# Patient Record
Sex: Male | Born: 1935 | Race: White | Hispanic: No | Marital: Married | Smoking: Never smoker
Health system: Southern US, Community
[De-identification: ages and names within clinical notes are randomized; demographics above are authoritative.]

## PROBLEM LIST (undated history)

## (undated) DIAGNOSIS — E785 Hyperlipidemia, unspecified: Secondary | ICD-10-CM

## (undated) DIAGNOSIS — I10 Essential (primary) hypertension: Secondary | ICD-10-CM

## (undated) DIAGNOSIS — F0280 Dementia in other diseases classified elsewhere without behavioral disturbance: Secondary | ICD-10-CM

## (undated) HISTORY — DX: Dementia in other diseases classified elsewhere, unspecified severity, without behavioral disturbance, psychotic disturbance, mood disturbance, and anxiety: F02.80

## (undated) HISTORY — PX: OTHER SURGICAL HISTORY: SHX169

## (undated) HISTORY — DX: Hyperlipidemia, unspecified: E78.5

## (undated) HISTORY — DX: Essential (primary) hypertension: I10

---

## 2003-07-19 ENCOUNTER — Other Ambulatory Visit: Payer: Self-pay

## 2004-11-06 ENCOUNTER — Ambulatory Visit: Payer: Self-pay | Admitting: Unknown Physician Specialty

## 2006-07-07 ENCOUNTER — Emergency Department: Payer: Self-pay | Admitting: Emergency Medicine

## 2006-07-07 ENCOUNTER — Other Ambulatory Visit: Payer: Self-pay

## 2010-03-18 ENCOUNTER — Ambulatory Visit: Payer: Self-pay | Admitting: Unknown Physician Specialty

## 2010-03-20 LAB — PATHOLOGY REPORT

## 2013-07-03 ENCOUNTER — Ambulatory Visit: Payer: Self-pay | Admitting: Neurology

## 2016-02-06 ENCOUNTER — Emergency Department: Payer: Medicare Other

## 2016-02-06 ENCOUNTER — Emergency Department
Admission: EM | Admit: 2016-02-06 | Discharge: 2016-02-06 | Disposition: A | Discharge status: Home / Self Care | Payer: Medicare Other | Attending: Emergency Medicine | Admitting: Emergency Medicine

## 2016-02-06 ENCOUNTER — Encounter: Payer: Self-pay | Admitting: *Deleted

## 2016-02-06 DIAGNOSIS — R4182 Altered mental status, unspecified: Secondary | ICD-10-CM | POA: Diagnosis not present

## 2016-02-06 DIAGNOSIS — Z7982 Long term (current) use of aspirin: Secondary | ICD-10-CM | POA: Diagnosis not present

## 2016-02-06 DIAGNOSIS — J111 Influenza due to unidentified influenza virus with other respiratory manifestations: Secondary | ICD-10-CM | POA: Diagnosis not present

## 2016-02-06 DIAGNOSIS — Z7984 Long term (current) use of oral hypoglycemic drugs: Secondary | ICD-10-CM | POA: Diagnosis not present

## 2016-02-06 DIAGNOSIS — R05 Cough: Secondary | ICD-10-CM | POA: Diagnosis present

## 2016-02-06 LAB — COMPREHENSIVE METABOLIC PANEL
ALK PHOS: 42 U/L (ref 38–126)
ALT: 24 U/L (ref 17–63)
ANION GAP: 9 (ref 5–15)
AST: 36 U/L (ref 15–41)
Albumin: 4.3 g/dL (ref 3.5–5.0)
BUN: 24 mg/dL — ABNORMAL HIGH (ref 6–20)
CO2: 25 mmol/L (ref 22–32)
CREATININE: 1.46 mg/dL — AB (ref 0.61–1.24)
Calcium: 8.9 mg/dL (ref 8.9–10.3)
Chloride: 99 mmol/L — ABNORMAL LOW (ref 101–111)
GFR calc Af Amer: 50 mL/min — ABNORMAL LOW (ref >60)
GFR calc non Af Amer: 43 mL/min — ABNORMAL LOW (ref >60)
Glucose, Bld: 95 mg/dL (ref 65–99)
Potassium: 4.4 mmol/L (ref 3.5–5.1)
Sodium: 133 mmol/L — ABNORMAL LOW (ref 135–145)
Total Bilirubin: 0.7 mg/dL (ref 0.3–1.2)
Total Protein: 7 g/dL (ref 6.5–8.1)

## 2016-02-06 LAB — CBC
HCT: 39 % — ABNORMAL LOW (ref 40.0–52.0)
HEMOGLOBIN: 13.4 g/dL (ref 13.0–18.0)
MCH: 31.4 pg (ref 26.0–34.0)
MCHC: 34.4 g/dL (ref 32.0–36.0)
MCV: 91.3 fL (ref 80.0–100.0)
PLATELETS: 100 10*3/uL — AB (ref 150–440)
RBC: 4.27 MIL/uL — ABNORMAL LOW (ref 4.40–5.90)
RDW: 12.7 % (ref 11.5–14.5)
WBC: 5.2 10*3/uL (ref 3.8–10.6)

## 2016-02-06 LAB — INFLUENZA PANEL BY PCR (TYPE A & B)
INFLBPCR: NEGATIVE
Influenza A By PCR: POSITIVE — AB

## 2016-02-06 LAB — LACTIC ACID, PLASMA: LACTIC ACID, VENOUS: 1 mmol/L (ref 0.5–1.9)

## 2016-02-06 LAB — URINALYSIS, COMPLETE (UACMP) WITH MICROSCOPIC
BACTERIA UA: NONE SEEN
Bilirubin Urine: NEGATIVE
Glucose, UA: NEGATIVE mg/dL
KETONES UR: NEGATIVE mg/dL
Leukocytes, UA: NEGATIVE
NITRITE: NEGATIVE
PROTEIN: 30 mg/dL — AB
Specific Gravity, Urine: 1.018 (ref 1.005–1.030)
pH: 6 (ref 5.0–8.0)

## 2016-02-06 LAB — TROPONIN I
Troponin I: 0.04 ng/mL (ref <0.03)
Troponin I: 0.04 ng/mL (ref <0.03)

## 2016-02-06 MED ORDER — ACETAMINOPHEN 500 MG PO TABS
ORAL_TABLET | ORAL | Status: AC
  Filled 2016-02-06: qty 2

## 2016-02-06 MED ORDER — ACETAMINOPHEN 500 MG PO TABS
1000.0000 mg | ORAL_TABLET | Freq: Once | ORAL | Status: AC
  Administered 2016-02-06: 1000 mg via ORAL

## 2016-02-06 MED ORDER — SODIUM CHLORIDE 0.9 % IV BOLUS (SEPSIS)
1000.0000 mL | Freq: Once | INTRAVENOUS | Status: AC
  Administered 2016-02-06: 1000 mL via INTRAVENOUS

## 2016-02-06 NOTE — ED Provider Notes (Signed)
[facility] Emergency Department Provider Note  ____________________________________________   I have reviewed the triage vital signs and the nursing notes.   HISTORY  Chief Complaint Altered Mental Status; Fall; and Code Sepsis   History limited by: dementia, history obtained from wife   HPI Bruce [name] is a 83 y.o. male who presents to the emergency department today because of wife's concern for increased confusion and abnormal behavior. The wife states that she really noticed him acting strangely last night. He also was incontinent of urine and missed the toilet. She states this is very unusual for the patient. She states for the past 3 days however the patient has had cold-like symptoms. He has had a cough. He also is complaining of some headache. The patient has had no known sick contacts.   No past medical history on file.  There are no active problems to display for this patient.   No past surgical history on file.  Prior to Admission medications   Not on File    Allergies Patient has no known allergies.  No family history on file.  Social History Social History  Substance Use Topics  . Smoking status: Never Smoker  . Smokeless tobacco: Never Used  . Alcohol use Yes    Review of Systems  Constitutional: Negative for fever. Cardiovascular: Negative for chest pain. Respiratory: Positive for cough. Gastrointestinal: Negative for abdominal pain, vomiting and diarrhea. Genitourinary: Negative for dysuria. Musculoskeletal: Negative for back pain. Skin: Negative for rash. Neurological: Positive for headache.  10-point ROS otherwise negative.  ____________________________________________   PHYSICAL EXAM:  VITAL SIGNS: ED Triage Vitals  Enc Vitals Group     BP 02/06/16 1558 (!) 127/49     Pulse Rate 02/06/16 1558 76     Resp 02/06/16 1558 20     Temp 02/06/16 1558 (!) 100.5 F (38.1 C)     Temp Source 02/06/16 1558 Oral     SpO2 02/06/16 1558 98 %     Weight 02/06/16 1555 135 lb (61.2 kg)     Height 02/06/16 1555 5\' 6"  (1.676 m)   Constitutional: Awake and alert. No acute distress. Eyes: Conjunctivae are normal. Normal extraocular movements. ENT   Head: Normocephalic and atraumatic.   Nose: No congestion/rhinnorhea.   Mouth/Throat: Mucous membranes are moist.   Neck: No stridor. Hematological/Lymphatic/Immunilogical: No cervical lymphadenopathy. Cardiovascular: Normal rate, regular rhythm.  No murmurs, rubs, or gallops.  Respiratory: Normal respiratory effort without tachypnea nor retractions. Breath sounds are clear and equal bilaterally. No wheezes/rales/rhonchi. Gastrointestinal: Soft and non tender. No rebound. No guarding.  Genitourinary: Deferred Musculoskeletal: Normal range of motion in all extremities. No lower extremity edema. Neurologic:  Normal speech and language. Not completely oriented to event or place. No gross focal neurologic deficits are appreciated.  Skin:  Skin is warm, dry and intact. No rash noted. Psychiatric: Mood and affect are normal. Speech and behavior are normal. Patient exhibits appropriate insight and judgment.  ____________________________________________    LABS (pertinent positives/negatives)  Labs Reviewed  COMPREHENSIVE METABOLIC PANEL - Abnormal; Notable for the following:       Result Value   Sodium 133 (*)    Chloride 99 (*)    BUN 24 (*)    Creatinine, Ser 1.46 (*)    GFR calc non Af Amer 43 (*)    GFR calc Af Amer 50 (*)    All other components within normal limits  CBC - Abnormal; Notable for the following:    RBC  4.27 (*)    HCT 39.0 (*)    Platelets 100 (*)    All other components within normal limits  URINALYSIS, COMPLETE (UACMP) WITH MICROSCOPIC - Abnormal; Notable for the following:    Color, Urine YELLOW (*)    APPearance CLEAR (*)    Hgb urine dipstick SMALL (*)    Protein, ur 30 (*)    Squamous Epithelial / LPF 0-5 (*)     All other components within normal limits  TROPONIN I - Abnormal; Notable for the following:    Troponin I 0.04 (*)    All other components within normal limits  INFLUENZA PANEL BY PCR (TYPE A & B) - Abnormal; Notable for the following:    Influenza A By PCR POSITIVE (*)    All other components within normal limits  LACTIC ACID, PLASMA  LACTIC ACID, PLASMA     ____________________________________________   EKG  I, Phineas SemenGraydon Tashanti Dalporto, attending physician, personally viewed and interpreted this EKG  EKG Time: 1607 Rate: 77 Rhythm: normal sinus rhythm Axis: normal Intervals: qtc 425 QRS: narrow ST changes: no st elevation, t wave inversion V2-V6 Impression: abnormal ekg   ____________________________________________    RADIOLOGY  CXR   IMPRESSION:  No active cardiopulmonary disease.   ____________________________________________   PROCEDURES  Procedures  ____________________________________________   INITIAL IMPRESSION / ASSESSMENT AND PLAN / ED COURSE  Pertinent labs & imaging results that were available during my care of the patient were reviewed by me and considered in my medical decision making (see chart for details).  Patient brought in by wife because of concerns for some confusion and possible UTI. Urine however did not appear grossly infected. Patient was flu positive. Wife did mention patient has had cold symptoms for a few days. This point I think he is outside of the window for Tamiflu. No elevated leukocytosis or lactic acidosis. Think patient can safely be treated as an outpatient.  ____________________________________________   FINAL CLINICAL IMPRESSION(S) / ED DIAGNOSES  Final diagnoses:  Influenza     Note: This dictation was prepared with Dragon dictation. Any transcriptional errors that result from this process are unintentional     Phineas SemenGraydon Marrissa Dai, MD 02/06/16 2131

## 2016-02-06 NOTE — ED Notes (Signed)
Patient denies pain and is resting comfortably.  

## 2016-02-06 NOTE — ED Notes (Signed)
Blood cultures drawn and labeled at bedside, no orders at this time.

## 2016-02-06 NOTE — ED Triage Notes (Signed)
Wife states pt has been confused since yesterday.  Pt has Alz dz and wife states a big change since yesterday.  Pt has urinary incontinence several times since yesterday which is new onset. Pt fell today.  Denies pain.  Ems came to scene.  Pt alert.  Pt answers some questions approp. In triage.

## 2016-02-06 NOTE — Discharge Instructions (Signed)
Please seek medical attention for any high fevers, chest pain, shortness of breath, change in behavior, persistent vomiting, bloody stool or any other new or concerning symptoms.  

## 2016-02-06 NOTE — ED Notes (Signed)
AAOx3.  Skin warm and dry.  NAD 

## 2016-02-26 ENCOUNTER — Other Ambulatory Visit: Payer: Self-pay | Admitting: Internal Medicine

## 2016-02-26 DIAGNOSIS — M4856XA Collapsed vertebra, not elsewhere classified, lumbar region, initial encounter for fracture: Secondary | ICD-10-CM

## 2016-03-06 ENCOUNTER — Ambulatory Visit
Admission: RE | Admit: 2016-03-06 | Discharge: 2016-03-06 | Disposition: A | Discharge status: Home / Self Care | Payer: Medicare Other | Source: Ambulatory Visit | Attending: Internal Medicine | Admitting: Internal Medicine

## 2016-03-06 DIAGNOSIS — M48061 Spinal stenosis, lumbar region without neurogenic claudication: Secondary | ICD-10-CM | POA: Diagnosis not present

## 2016-03-06 DIAGNOSIS — M4854XA Collapsed vertebra, not elsewhere classified, thoracic region, initial encounter for fracture: Secondary | ICD-10-CM | POA: Insufficient documentation

## 2016-03-06 DIAGNOSIS — M4856XA Collapsed vertebra, not elsewhere classified, lumbar region, initial encounter for fracture: Secondary | ICD-10-CM | POA: Diagnosis present

## 2016-03-06 DIAGNOSIS — M5126 Other intervertebral disc displacement, lumbar region: Secondary | ICD-10-CM | POA: Diagnosis not present

## 2016-06-09 ENCOUNTER — Ambulatory Visit (INDEPENDENT_AMBULATORY_CARE_PROVIDER_SITE_OTHER): Payer: Medicare Other

## 2016-06-09 ENCOUNTER — Encounter (INDEPENDENT_AMBULATORY_CARE_PROVIDER_SITE_OTHER): Payer: Self-pay | Admitting: Vascular Surgery

## 2016-06-09 ENCOUNTER — Ambulatory Visit (INDEPENDENT_AMBULATORY_CARE_PROVIDER_SITE_OTHER): Payer: Medicare Other | Admitting: Vascular Surgery

## 2016-06-09 ENCOUNTER — Other Ambulatory Visit (INDEPENDENT_AMBULATORY_CARE_PROVIDER_SITE_OTHER): Payer: Self-pay | Admitting: Vascular Surgery

## 2016-06-09 VITALS — BP 123/71 | HR 56 | Resp 16 | Wt 137.6 lb

## 2016-06-09 DIAGNOSIS — E118 Type 2 diabetes mellitus with unspecified complications: Secondary | ICD-10-CM

## 2016-06-09 DIAGNOSIS — I10 Essential (primary) hypertension: Secondary | ICD-10-CM | POA: Diagnosis not present

## 2016-06-09 DIAGNOSIS — I6523 Occlusion and stenosis of bilateral carotid arteries: Secondary | ICD-10-CM

## 2016-06-09 DIAGNOSIS — E785 Hyperlipidemia, unspecified: Secondary | ICD-10-CM | POA: Diagnosis not present

## 2016-06-09 DIAGNOSIS — E1129 Type 2 diabetes mellitus with other diabetic kidney complication: Secondary | ICD-10-CM | POA: Insufficient documentation

## 2016-06-09 DIAGNOSIS — I6529 Occlusion and stenosis of unspecified carotid artery: Secondary | ICD-10-CM | POA: Insufficient documentation

## 2016-06-09 DIAGNOSIS — E119 Type 2 diabetes mellitus without complications: Secondary | ICD-10-CM | POA: Insufficient documentation

## 2016-06-09 NOTE — Progress Notes (Signed)
MRN : [id number]  Bruce [name] is a 83 y.o. (21-Aug-1932) male who presents with chief complaint of  Chief Complaint  Patient presents with  . Follow-up  .  History of Present Illness: Patient returns in follow-up of his carotid disease. His wife says that his Alzheimer's has progressed, but otherwise he has had no major issues. He denies any focal neurologic symptoms. Specifically, the patient denies amaurosis fugax, speech or swallowing difficulties, or arm or leg weakness or numbness. His duplex today demonstrates a patent right carotid endarterectomy and stable, mild left carotid artery stenosis of less than 50%.   Current Outpatient Prescriptions  Medication Sig Dispense Refill  . aspirin EC 81 MG tablet Take 1 tablet by mouth daily.    . diphenhydrAMINE (BENADRYL) 25 mg capsule Take 1 capsule by mouth every 6 (six) hours as needed.    . docusate sodium (COLACE) 100 MG capsule Take 1 capsule by mouth 2 (two) times daily.    Marland Kitchen. doxazosin (CARDURA) 4 MG tablet Take 1 tablet by mouth daily.    Marland Kitchen. glimepiride (AMARYL) 2 MG tablet Take 1 tablet by mouth daily.    Marland Kitchen. ibuprofen (ADVIL,MOTRIN) 200 MG tablet Take 1 tablet by mouth every 6 (six) hours as needed.    Marland Kitchen. lisinopril (PRINIVIL,ZESTRIL) 20 MG tablet Take 1 tablet by mouth daily.    . memantine (NAMENDA) 5 MG tablet Take 1 tablet by mouth 2 (two) times daily.    . metFORMIN (GLUCOPHAGE) 500 MG tablet Take 1 tablet by mouth 2 (two) times daily.    Marland Kitchen. omeprazole (PRILOSEC) 20 MG capsule Take 1 capsule by mouth daily.    . simvastatin (ZOCOR) 20 MG tablet Take 1 tablet by mouth daily.    . Multiple Vitamin (MULTI-VITAMINS) TABS Take 1 tablet by mouth daily.    . vitamin E 400 UNIT capsule Take 1 capsule by mouth daily.     No current facility-administered medications for this visit.     Past medical history Dementia Carotid artery stenosis BPH Diabetes Hypertension Hyperlipidemia  Past surgical history Right carotid  endarterectomy about 15 years ago   Social History Social History  Substance Use Topics  . Smoking status: Never Smoker  . Smokeless tobacco: Never Used  . Alcohol use Yes    Family History No bleeding or clotting disorders  No Known Allergies   REVIEW OF SYSTEMS (Negative unless checked)  Constitutional: [] Weight loss  [] Fever  [] Chills Cardiac: [] Chest pain   [] Chest pressure   [] Palpitations   [] Shortness of breath when laying flat   [] Shortness of breath at rest   [] Shortness of breath with exertion. Vascular:  [] Pain in legs with walking   [] Pain in legs at rest   [] Pain in legs when laying flat   [] Claudication   [] Pain in feet when walking  [] Pain in feet at rest  [] Pain in feet when laying flat   [] History of DVT   [] Phlebitis   [] Swelling in legs   [] Varicose veins   [] Non-healing ulcers Pulmonary:   [] Uses home oxygen   [] Productive cough   [] Hemoptysis   [] Wheeze  [] COPD   [] Asthma Neurologic:  [] Dizziness  [] Blackouts   [] Seizures   [] History of stroke   [] History of TIA  [] Aphasia   [] Temporary blindness   [] Dysphagia   [] Weakness or numbness in arms   [] Weakness or numbness in legs  X positive for dementia Musculoskeletal:  [x] Arthritis   [] Joint swelling   [] Joint pain   []   Low back pain Hematologic:  [] Easy bruising  [] Easy bleeding   [] Hypercoagulable state   [] Anemic  [] Hepatitis Gastrointestinal:  [] Blood in stool   [] Vomiting blood  [] Gastroesophageal reflux/heartburn   [] Difficulty swallowing. Genitourinary:  [] Chronic kidney disease   [] Difficult urination  [x] Frequent urination  [] Burning with urination   [] Blood in urine Skin:  [] Rashes   [] Ulcers   [] Wounds Psychological:  [] History of anxiety   []  History of major depression.  Physical Examination  Vitals:   06/09/16 1101  BP: 123/71  Pulse: (!) 56  Resp: 16  Weight: 137 lb 9.6 oz (62.4 kg)   Body mass index is 22.21 kg/m. Gen:  WD/WN, NAD Head: Iron/AT, No temporalis wasting. Ear/Nose/Throat:  Hearing grossly intact, nares w/o erythema or drainage, trachea midline Eyes: Conjunctiva clear. Sclera non-icteric Neck: Supple.  No bruit or JVD.  Pulmonary:  Good air movement, equal and clear to auscultation bilaterally.  Cardiac: RRR, normal S1, S2, no Murmurs, rubs or gallops. Vascular:  Vessel Right Left  Radial Palpable Palpable                                   Gastrointestinal: soft, non-tender/non-distended.  Musculoskeletal: M/S 5/5 throughout.  No deformity or atrophy.  Neurologic: CN 2-12 intact. Sensation grossly intact in extremities.  Symmetrical.  Speech is fluent. Motor exam as listed above. Psychiatric: Flat affect. Poor historian Dermatologic: No rashes or ulcers noted.  No cellulitis or open wounds.      CBC Lab Results  Component Value Date   WBC 5.2 02/06/2016   HGB 13.4 02/06/2016   HCT 39.0 (L) 02/06/2016   MCV 91.3 02/06/2016   PLT 100 (L) 02/06/2016    BMET    Component Value Date/Time   NA 133 (L) 02/06/2016 1631   K 4.4 02/06/2016 1631   CL 99 (L) 02/06/2016 1631   CO2 25 02/06/2016 1631   GLUCOSE 95 02/06/2016 1631   BUN 24 (H) 02/06/2016 1631   CREATININE 1.46 (H) 02/06/2016 1631   CALCIUM 8.9 02/06/2016 1631   GFRNONAA 43 (L) 02/06/2016 1631   GFRAA 50 (L) 02/06/2016 1631   CrCl cannot be calculated (Patient's most recent lab result is older than the maximum 21 days allowed.).  COAG No results found for: INR, PROTIME  Radiology No results found.    Assessment/Plan Essential hypertension, benign blood pressure control important in reducing the progression of atherosclerotic disease. On appropriate oral medications.   Hyperlipidemia lipid control important in reducing the progression of atherosclerotic disease. Continue statin therapy   Diabetes (HCC) blood glucose control important in reducing the progression of atherosclerotic disease. Also, involved in wound healing. On appropriate medications.   Carotid  stenosis His duplex today demonstrates a patent right carotid endarterectomy and stable, mild left carotid artery stenosis of less than 50%. Continue aspirin therapy. Continue statin therapy. No intervention currently of benefit. Plan to recheck in 1 year.    Festus Barren, MD  06/09/2016 11:34 AM    This note was created with Dragon medical transcription system.  Any errors from dictation are purely unintentional

## 2016-06-09 NOTE — Assessment & Plan Note (Signed)
blood pressure control important in reducing the progression of atherosclerotic disease. On appropriate oral medications.  

## 2016-06-09 NOTE — Assessment & Plan Note (Signed)
His duplex today demonstrates a patent right carotid endarterectomy and stable, mild left carotid artery stenosis of less than 50%. Continue aspirin therapy. Continue statin therapy. No intervention currently of benefit. Plan to recheck in 1 year.

## 2016-06-09 NOTE — Assessment & Plan Note (Signed)
blood glucose control important in reducing the progression of atherosclerotic disease. Also, involved in wound healing. On appropriate medications.  

## 2016-06-09 NOTE — Assessment & Plan Note (Signed)
lipid control important in reducing the progression of atherosclerotic disease. Continue statin therapy  

## 2017-03-05 ENCOUNTER — Emergency Department: Payer: Medicare Other

## 2017-03-05 ENCOUNTER — Observation Stay
Admission: EM | Admit: 2017-03-05 | Discharge: 2017-03-07 | Disposition: A | Discharge status: Home Health | Payer: Medicare Other | Attending: Internal Medicine | Admitting: Internal Medicine

## 2017-03-05 ENCOUNTER — Encounter: Payer: Self-pay | Admitting: *Deleted

## 2017-03-05 ENCOUNTER — Other Ambulatory Visit: Payer: Self-pay

## 2017-03-05 DIAGNOSIS — S066X9A Traumatic subarachnoid hemorrhage with loss of consciousness of unspecified duration, initial encounter: Secondary | ICD-10-CM | POA: Insufficient documentation

## 2017-03-05 DIAGNOSIS — I10 Essential (primary) hypertension: Secondary | ICD-10-CM | POA: Insufficient documentation

## 2017-03-05 DIAGNOSIS — Z951 Presence of aortocoronary bypass graft: Secondary | ICD-10-CM | POA: Diagnosis not present

## 2017-03-05 DIAGNOSIS — G309 Alzheimer's disease, unspecified: Secondary | ICD-10-CM | POA: Diagnosis not present

## 2017-03-05 DIAGNOSIS — Z79899 Other long term (current) drug therapy: Secondary | ICD-10-CM | POA: Insufficient documentation

## 2017-03-05 DIAGNOSIS — F0280 Dementia in other diseases classified elsewhere without behavioral disturbance: Secondary | ICD-10-CM | POA: Diagnosis not present

## 2017-03-05 DIAGNOSIS — S40011A Contusion of right shoulder, initial encounter: Secondary | ICD-10-CM | POA: Diagnosis not present

## 2017-03-05 DIAGNOSIS — E785 Hyperlipidemia, unspecified: Secondary | ICD-10-CM | POA: Diagnosis not present

## 2017-03-05 DIAGNOSIS — M79602 Pain in left arm: Secondary | ICD-10-CM | POA: Diagnosis not present

## 2017-03-05 DIAGNOSIS — Z66 Do not resuscitate: Secondary | ICD-10-CM | POA: Insufficient documentation

## 2017-03-05 DIAGNOSIS — Z7982 Long term (current) use of aspirin: Secondary | ICD-10-CM | POA: Insufficient documentation

## 2017-03-05 DIAGNOSIS — S065X9A Traumatic subdural hemorrhage with loss of consciousness of unspecified duration, initial encounter: Secondary | ICD-10-CM | POA: Diagnosis present

## 2017-03-05 DIAGNOSIS — S065XAA Traumatic subdural hemorrhage with loss of consciousness status unknown, initial encounter: Secondary | ICD-10-CM | POA: Diagnosis present

## 2017-03-05 DIAGNOSIS — Z7984 Long term (current) use of oral hypoglycemic drugs: Secondary | ICD-10-CM | POA: Insufficient documentation

## 2017-03-05 DIAGNOSIS — R32 Unspecified urinary incontinence: Secondary | ICD-10-CM | POA: Insufficient documentation

## 2017-03-05 DIAGNOSIS — N400 Enlarged prostate without lower urinary tract symptoms: Secondary | ICD-10-CM | POA: Diagnosis not present

## 2017-03-05 DIAGNOSIS — E1151 Type 2 diabetes mellitus with diabetic peripheral angiopathy without gangrene: Secondary | ICD-10-CM | POA: Insufficient documentation

## 2017-03-05 DIAGNOSIS — W19XXXA Unspecified fall, initial encounter: Secondary | ICD-10-CM | POA: Insufficient documentation

## 2017-03-05 DIAGNOSIS — Y9302 Activity, running: Secondary | ICD-10-CM | POA: Diagnosis not present

## 2017-03-05 DIAGNOSIS — G3189 Other specified degenerative diseases of nervous system: Secondary | ICD-10-CM | POA: Diagnosis not present

## 2017-03-05 DIAGNOSIS — I6200 Nontraumatic subdural hemorrhage, unspecified: Secondary | ICD-10-CM

## 2017-03-05 DIAGNOSIS — M7989 Other specified soft tissue disorders: Secondary | ICD-10-CM | POA: Insufficient documentation

## 2017-03-05 DIAGNOSIS — S8001XA Contusion of right knee, initial encounter: Secondary | ICD-10-CM | POA: Insufficient documentation

## 2017-03-05 LAB — CBC
HCT: 42.6 % (ref 40.0–52.0)
HCT: 43.4 % (ref 40.0–52.0)
HEMOGLOBIN: 14.3 g/dL (ref 13.0–18.0)
Hemoglobin: 14.3 g/dL (ref 13.0–18.0)
MCH: 30.7 pg (ref 26.0–34.0)
MCH: 30 pg (ref 26.0–34.0)
MCHC: 33.4 g/dL (ref 32.0–36.0)
MCHC: 32.8 g/dL (ref 32.0–36.0)
MCV: 91.8 fL (ref 80.0–100.0)
MCV: 91.5 fL (ref 80.0–100.0)
PLATELETS: 117 10*3/uL — AB (ref 150–440)
Platelets: 119 10*3/uL — ABNORMAL LOW (ref 150–440)
RBC: 4.64 MIL/uL (ref 4.40–5.90)
RBC: 4.75 MIL/uL (ref 4.40–5.90)
RDW: 13.1 % (ref 11.5–14.5)
RDW: 13.3 % (ref 11.5–14.5)
WBC: 6.4 10*3/uL (ref 3.8–10.6)
WBC: 5.7 10*3/uL (ref 3.8–10.6)

## 2017-03-05 LAB — CREATININE, SERUM
CREATININE: 1.14 mg/dL (ref 0.61–1.24)
GFR calc Af Amer: >60 mL/min (ref >60)
GFR, EST NON AFRICAN AMERICAN: 58 mL/min — AB (ref >60)

## 2017-03-05 LAB — COMPREHENSIVE METABOLIC PANEL
ALT: 20 U/L (ref 17–63)
ANION GAP: 9 (ref 5–15)
AST: 28 U/L (ref 15–41)
Albumin: 4.1 g/dL (ref 3.5–5.0)
Alkaline Phosphatase: 47 U/L (ref 38–126)
BUN: 26 mg/dL — ABNORMAL HIGH (ref 6–20)
CO2: 23 mmol/L (ref 22–32)
Calcium: 8.8 mg/dL — ABNORMAL LOW (ref 8.9–10.3)
Chloride: 104 mmol/L (ref 101–111)
Creatinine, Ser: 1.24 mg/dL (ref 0.61–1.24)
GFR calc non Af Amer: 52 mL/min — ABNORMAL LOW (ref >60)
Glucose, Bld: 161 mg/dL — ABNORMAL HIGH (ref 65–99)
Potassium: 4.2 mmol/L (ref 3.5–5.1)
SODIUM: 136 mmol/L (ref 135–145)
Total Bilirubin: 0.6 mg/dL (ref 0.3–1.2)
Total Protein: 6.7 g/dL (ref 6.5–8.1)

## 2017-03-05 LAB — GLUCOSE, CAPILLARY
GLUCOSE-CAPILLARY: 127 mg/dL — AB (ref 65–99)
Glucose-Capillary: 133 mg/dL — ABNORMAL HIGH (ref 65–99)
Glucose-Capillary: 166 mg/dL — ABNORMAL HIGH (ref 65–99)

## 2017-03-05 LAB — PROTIME-INR
INR: 0.99
PROTHROMBIN TIME: 13 s (ref 11.4–15.2)

## 2017-03-05 LAB — TROPONIN I: Troponin I: <0.03 ng/mL (ref <0.03)

## 2017-03-05 MED ORDER — RAMELTEON 8 MG PO TABS
8.0000 mg | ORAL_TABLET | Freq: Every day | ORAL | Status: DC
  Administered 2017-03-05 – 2017-03-06 (×2): 8 mg via ORAL
  Filled 2017-03-05 (×3): qty 1

## 2017-03-05 MED ORDER — ACETAMINOPHEN 650 MG RE SUPP: 650.0000 mg | Freq: Four times a day (QID) | RECTAL | Status: DC | PRN

## 2017-03-05 MED ORDER — MEMANTINE HCL 5 MG PO TABS
5.0000 mg | ORAL_TABLET | Freq: Two times a day (BID) | ORAL | Status: DC
  Administered 2017-03-05 – 2017-03-07 (×4): 5 mg via ORAL
  Filled 2017-03-05 (×5): qty 1

## 2017-03-05 MED ORDER — ACETAMINOPHEN 325 MG PO TABS: 650.0000 mg | ORAL_TABLET | Freq: Four times a day (QID) | ORAL | Status: DC | PRN

## 2017-03-05 MED ORDER — ONDANSETRON HCL 4 MG/2ML IJ SOLN: 4.0000 mg | Freq: Four times a day (QID) | INTRAMUSCULAR | Status: DC | PRN

## 2017-03-05 MED ORDER — SODIUM CHLORIDE 0.9 % IV SOLN
INTRAVENOUS | Status: DC
  Administered 2017-03-05: 10:00:00 via INTRAVENOUS

## 2017-03-05 MED ORDER — BISACODYL 5 MG PO TBEC: 5.0000 mg | DELAYED_RELEASE_TABLET | Freq: Every day | ORAL | Status: DC | PRN

## 2017-03-05 MED ORDER — HYDROCODONE-ACETAMINOPHEN 5-325 MG PO TABS: 1.0000 | ORAL_TABLET | ORAL | Status: DC | PRN

## 2017-03-05 MED ORDER — HEPARIN SODIUM (PORCINE) 5000 UNIT/ML IJ SOLN: 5000.0000 [IU] | Freq: Three times a day (TID) | INTRAMUSCULAR | Status: DC

## 2017-03-05 MED ORDER — ONDANSETRON HCL 4 MG PO TABS: 4.0000 mg | ORAL_TABLET | Freq: Four times a day (QID) | ORAL | Status: DC | PRN

## 2017-03-05 MED ORDER — DOCUSATE SODIUM 100 MG PO CAPS: 100.0000 mg | ORAL_CAPSULE | Freq: Two times a day (BID) | ORAL | Status: DC

## 2017-03-05 MED ORDER — SIMVASTATIN 20 MG PO TABS
20.0000 mg | ORAL_TABLET | Freq: Every day | ORAL | Status: DC
  Administered 2017-03-05: 17:00:00 20 mg via ORAL
  Filled 2017-03-05: qty 1

## 2017-03-05 MED ORDER — AMITRIPTYLINE HCL 50 MG PO TABS
25.0000 mg | ORAL_TABLET | Freq: Every day | ORAL | Status: DC
  Filled 2017-03-05: qty 0.5

## 2017-03-05 MED ORDER — DOCUSATE SODIUM 100 MG PO CAPS
100.0000 mg | ORAL_CAPSULE | Freq: Two times a day (BID) | ORAL | Status: DC
  Administered 2017-03-05 – 2017-03-06 (×3): 100 mg via ORAL
  Filled 2017-03-05 (×5): qty 1

## 2017-03-05 MED ORDER — TRAZODONE HCL 50 MG PO TABS: 25.0000 mg | ORAL_TABLET | Freq: Every evening | ORAL | Status: DC | PRN

## 2017-03-05 MED ORDER — ORAL CARE MOUTH RINSE
15.0000 mL | Freq: Two times a day (BID) | OROMUCOSAL | Status: DC
  Administered 2017-03-05 – 2017-03-07 (×4): 15 mL via OROMUCOSAL

## 2017-03-05 MED ORDER — INSULIN ASPART 100 UNIT/ML ~~LOC~~ SOLN
0.0000 [IU] | Freq: Three times a day (TID) | SUBCUTANEOUS | Status: DC
  Administered 2017-03-05: 2 [IU] via SUBCUTANEOUS
  Administered 2017-03-05: 1 [IU] via SUBCUTANEOUS
  Administered 2017-03-06: 7 [IU] via SUBCUTANEOUS
  Administered 2017-03-07: 13:00:00 1 [IU] via SUBCUTANEOUS
  Filled 2017-03-05 (×5): qty 1

## 2017-03-05 NOTE — ED Notes (Signed)
ED Provider at bedside. 

## 2017-03-05 NOTE — Consult Note (Signed)
Reason for Consult:SDH Referring Physician: Luberta Mutter  CC: Fall  HPI: Bruce [name] is an 84 y.o. male with a history of dementia who was walking with wife on yesterday and decided that he wanted to run.  Patient fell at that time.  Initially refused to come to the hospital.  Wife noted that he was more confused and was incontinent overnight and was able to convince him to come to the hospital today.   Wife reports that at baseline short term memory is very poor and has been worsening.  Also reports that his balance is poor and has been worsening as well.  He dresses himself but she must get his clothes together.  He is at times incontinent of urine. In the past week was placed on Elavil.  Feels has been going downhill quicker since that time.    Past medical history: Hypertension, Dementia, HLD, BPH, PVD  Surgical history: CABG, carotid endarterectomy, brain surgery for drainage of hematoma  Family history: Parents deceased.  Mother with dementia.  Father with stroke and diabetes.  Social History:  reports that  has never smoked. he has never used smokeless tobacco. He reports that he drinks alcohol. His drug history is not on file.  No Known Allergies  Medications:  I have reviewed the patient's current medications. Prior to Admission:  Medications Prior to Admission  Medication Sig Dispense Refill Last Dose  . amitriptyline (ELAVIL) 25 MG tablet Take 25 mg by mouth at bedtime.   03/04/2017 at Unknown time  . aspirin EC 81 MG tablet Take 1 tablet by mouth daily.   03/04/2017 at Unknown time  . diphenhydrAMINE (BENADRYL) 25 mg capsule Take 1 capsule by mouth every 6 (six) hours as needed.   prn  . docusate sodium (COLACE) 100 MG capsule Take 1 capsule by mouth 2 (two) times daily.   03/04/2017 at Unknown time  . doxazosin (CARDURA) 4 MG tablet Take 1 tablet by mouth daily.   03/04/2017 at Unknown time  . glimepiride (AMARYL) 2 MG tablet Take 1 tablet by mouth daily.   03/04/2017 at Unknown  time  . ibuprofen (ADVIL,MOTRIN) 200 MG tablet Take 1 tablet by mouth every 6 (six) hours as needed.   prn  . lisinopril (PRINIVIL,ZESTRIL) 20 MG tablet Take 1 tablet by mouth daily.   03/04/2017 at Unknown time  . memantine (NAMENDA) 5 MG tablet Take 1 tablet by mouth 2 (two) times daily.   03/04/2017 at Unknown time  . metFORMIN (GLUCOPHAGE) 500 MG tablet Take 1 tablet by mouth 2 (two) times daily.   03/04/2017 at Unknown time  . Multiple Vitamin (MULTI-VITAMINS) TABS Take 1 tablet by mouth daily.   03/04/2017 at Unknown time  . omeprazole (PRILOSEC) 20 MG capsule Take 1 capsule by mouth daily.   03/04/2017 at Unknown time  . simvastatin (ZOCOR) 20 MG tablet Take 1 tablet by mouth daily.   03/04/2017 at Unknown time   Scheduled: . amitriptyline  25 mg Oral QHS  . docusate sodium  100 mg Oral BID  . insulin aspart  0-9 Units Subcutaneous TID WC  . mouth rinse  15 mL Mouth Rinse BID  . memantine  5 mg Oral BID  . simvastatin  20 mg Oral q1800    ROS: History obtained from the patient and wife  General ROS: negative for - chills, fatigue, fever, night sweats, weight gain or weight loss Psychological ROS: memory difficulties Ophthalmic ROS: negative for - blurry vision, double vision, eye pain or  loss of vision ENT ROS: tinnitus Allergy and Immunology ROS: negative for - hives or itchy/watery eyes Hematological and Lymphatic ROS: negative for - bleeding problems, bruising or swollen lymph nodes Endocrine ROS: negative for - galactorrhea, hair pattern changes, polydipsia/polyuria or temperature intolerance Respiratory ROS: negative for - cough, hemoptysis, shortness of breath or wheezing Cardiovascular ROS: negative for - chest pain, dyspnea on exertion, edema or irregular heartbeat Gastrointestinal ROS: negative for - abdominal pain, diarrhea, hematemesis, nausea/vomiting or stool incontinence Genito-Urinary ROS: negative for - dysuria, hematuria, incontinence or urinary  frequency/urgency Musculoskeletal ROS: negative for - joint swelling or muscular weakness Neurological ROS: as noted in HPI Dermatological ROS: negative for rash and skin lesion changes  Physical Examination: Blood pressure (!) 136/55, pulse 71, temperature 98.3 F (36.8 C), temperature source Oral, resp. rate 20, height 5\' 7"  (1.702 m), weight 62.2 kg (137 lb 2 oz), SpO2 98 %.  HEENT-  Normocephalic, bruising and bandaging on left side of face.  Normal external eye and conjunctiva.  Normal TM's bilaterally.  Normal auditory canals and external ears. Normal external nose, mucus membranes and septum.  Normal pharynx. Cardiovascular- S1, S2 normal, pulses palpable throughout   Lungs- chest clear, no wheezing, rales, normal symmetric air entry Abdomen- soft, non-tender; bowel sounds normal; no masses,  no organomegaly Extremities- no edema Lymph-no adenopathy palpable Musculoskeletal-no joint tenderness, deformity or swelling Skin-warm and dry, no hyperpigmentation, vitiligo, or suspicious lesions  Neurological Examination  Mental Status: Alert.  Reports that it is 2021.  Speech fluent without evidence of aphasia.  Able to follow commands. Cranial Nerves: II: Discs flat bilaterally; Visual fields grossly normal, pupils equal, round, reactive to light and accommodation III,IV, VI: ptosis not present, extra-ocular motions intact bilaterally V,VII: left facial droop, facial light touch sensation normal bilaterally VIII: hearing normal bilaterally IX,X: gag reflex present XI: bilateral shoulder shrug XII: midline tongue extension Motor: Right : Upper extremity   5/5    Left:     Upper extremity   5/5  Lower extremity   5/5     Lower extremity   5/5 Tone and bulk:normal tone throughout; no atrophy noted Sensory: Pinprick and light touch intact throughout, bilaterally Deep Tendon Reflexes: 2+ in the upper extremities and absent in the lower extremities Plantars: Right: downgoing   Left:  downgoing Cerebellar: Normal finger-to-nose and normal heel-to-shin testing bilaterally Gait: not tested due to safety concerns    Laboratory Studies:   Basic Metabolic Panel: Recent Labs  Lab 03/05/17 0635 03/05/17 0946  NA 136  --   K 4.2  --   CL 104  --   CO2 23  --   GLUCOSE 161*  --   BUN 26*  --   CREATININE 1.24 1.14  CALCIUM 8.8*  --     Liver Function Tests: Recent Labs  Lab 03/05/17 0635  AST 28  ALT 20  ALKPHOS 47  BILITOT 0.6  PROT 6.7  ALBUMIN 4.1   No results for input(s): LIPASE, AMYLASE in the last 168 hours. No results for input(s): AMMONIA in the last 168 hours.  CBC: Recent Labs  Lab 03/05/17 0635 03/05/17 0946  WBC 6.4 5.7  HGB 14.3 14.3  HCT 42.6 43.4  MCV 91.8 91.5  PLT 117* 119*    Cardiac Enzymes: Recent Labs  Lab 03/05/17 0635  TROPONINI <0.03    BNP: Invalid input(s): POCBNP  CBG: No results for input(s): GLUCAP in the last 168 hours.  Microbiology: No results found for this or  any previous visit.  Coagulation Studies: Recent Labs    03/05/17 0635  LABPROT 13.0  INR 0.99    Urinalysis: No results for input(s): COLORURINE, LABSPEC, PHURINE, GLUCOSEU, HGBUR, BILIRUBINUR, KETONESUR, PROTEINUR, UROBILINOGEN, NITRITE, LEUKOCYTESUR in the last 168 hours.  Invalid input(s): APPERANCEUR  Lipid Panel:  No results found for: CHOL, TRIG, HDL, CHOLHDL, VLDL, LDLCALC  HgbA1C: No results found for: HGBA1C  Urine Drug Screen:  No results found for: LABOPIA, COCAINSCRNUR, LABBENZ, AMPHETMU, THCU, LABBARB  Alcohol Level: No results for input(s): ETH in the last 168 hours.  Imaging: Ct Head Wo Contrast  Result Date: 03/05/2017 CLINICAL DATA:  84 year old male with fall and facial trauma EXAM: CT HEAD WITHOUT CONTRAST CT MAXILLOFACIAL WITHOUT CONTRAST TECHNIQUE: Multidetector CT imaging of the head and maxillofacial structures were performed using the standard protocol without intravenous contrast. Multiplanar CT image  reconstructions of the maxillofacial structures were also generated. 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 There is a small amount of extra-axial hemorrhage along the right posterior parietal lobe adjacent to the midline, likely combination of small subdural and subarachnoid bleed and measures up to 8 mm (sagittal series 9, image 26). Small amount of subarachnoid hemorrhage is also noted in the right occipital lobe. There is no mass effect or midline shift. Vascular: No hyperdense vessel or unexpected calcification. Skull: Normal. Negative for fracture or focal lesion. Other: None CT MAXILLOFACIAL FINDINGS Osseous: There is no acute fracture or dislocation. Arthritic changes of the TMJ bilaterally. Orbits: The globes and retro-orbital fat are preserved Sinuses: Mild diffuse mucoperiosteal thickening of paranasal sinuses. Small bilateral maxillary sinus retention cysts or polyps. No air-fluid levels. Soft tissues: Left facial soft tissue contusion.  No large hematoma. IMPRESSION: 1. Right parietal and occipital extra-axial hemorrhage, likely combination of subdural and subarachnoid hemorrhage. No mass effect or midline shift. 2. No acute/traumatic facial bone fractures. These results were called by telephone at the time of interpretation on 03/05/2017 at 6:29 am to Dr. Bayard Males , who verbally acknowledged these results. Electronically Signed   By: Elgie Collard M.D.   On: 03/05/2017 06:36   Dg Humerus Left  Result Date: 03/05/2017 CLINICAL DATA:  84 year old male with fall and left upper extremity pain. EXAM: LEFT HUMERUS - 2+ VIEW; LEFT HAND - COMPLETE 3+ VIEW COMPARISON:  None. FINDINGS: There is no acute fracture or dislocation. The bones are osteopenic. Areas of  linear lucency the scapula, likely chronic changes and related to demineralization. There is soft tissue swelling of the dorsum of the hand. No radiopaque foreign object or soft tissue gas. IMPRESSION: 1. No acute fracture or dislocation. 2. Soft tissue swelling of the hand. Clinical correlation is recommended. Electronically Signed   By: Elgie Collard M.D.   On: 03/05/2017 06:13   Dg Hand Complete Left  Result Date: 03/05/2017 CLINICAL DATA:  84 year old male with fall and left upper extremity pain. EXAM: LEFT HUMERUS - 2+ VIEW; LEFT HAND - COMPLETE 3+ VIEW COMPARISON:  None. FINDINGS: There is no acute fracture or dislocation. The bones are osteopenic. Areas of linear lucency the scapula, likely chronic changes and related to demineralization. There is soft tissue swelling of the dorsum of the hand. No radiopaque foreign object or soft tissue gas. IMPRESSION: 1. No acute fracture or dislocation. 2. Soft tissue swelling of the  hand. Clinical correlation is recommended. Electronically Signed   By: Elgie CollardArash  Radparvar M.D.   On: 03/05/2017 06:13   Ct Maxillofacial Wo Contrast  Result Date: 03/05/2017 CLINICAL DATA:  84 year old male with fall and facial trauma EXAM: CT HEAD WITHOUT CONTRAST CT MAXILLOFACIAL WITHOUT CONTRAST TECHNIQUE: Multidetector CT imaging of the head and maxillofacial structures were performed using the standard protocol without intravenous contrast. Multiplanar CT image reconstructions of the maxillofacial structures were also generated. 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 There is a small amount of extra-axial hemorrhage along the right posterior parietal lobe adjacent to the midline, likely combination of small subdural and subarachnoid bleed  and measures up to 8 mm (sagittal series 9, image 26). Small amount of subarachnoid hemorrhage is also noted in the right occipital lobe. There is no mass effect or midline shift. Vascular: No hyperdense vessel or unexpected calcification. Skull: Normal. Negative for fracture or focal lesion. Other: None CT MAXILLOFACIAL FINDINGS Osseous: There is no acute fracture or dislocation. Arthritic changes of the TMJ bilaterally. Orbits: The globes and retro-orbital fat are preserved Sinuses: Mild diffuse mucoperiosteal thickening of paranasal sinuses. Small bilateral maxillary sinus retention cysts or polyps. No air-fluid levels. Soft tissues: Left facial soft tissue contusion.  No large hematoma. IMPRESSION: 1. Right parietal and occipital extra-axial hemorrhage, likely combination of subdural and subarachnoid hemorrhage. No mass effect or midline shift. 2. No acute/traumatic facial bone fractures. These results were called by telephone at the time of interpretation on 03/05/2017 at 6:29 am to Dr. Bayard MalesANDOLPH BROWN , who verbally acknowledged these results. Electronically Signed   By: Elgie CollardArash  Radparvar M.D.   On: 03/05/2017 06:36     Assessment/Plan: 84 year old male with a history of dementia presenting after a fall.  Head CT reviewed and shows a right parieto-occipital SDH/SAH.  Wife reports worsening gait and confusion although after further conversation it appears that the patient has also been having some decline over the past week related to the start of Elavil.  Neurosurgery has reviewed the films and do not feel that surgery is indicated.    Recommendations: 1. Repeat head CT in 24 hours.  If no change, no further imaging or intervention indicated. 2. D/C Elavil  Thana FarrLeslie Malesha Suliman, MD Neurology [phone] 03/05/2017, 11:55 AM

## 2017-03-05 NOTE — ED Notes (Signed)
Patient transported to X-ray 

## 2017-03-05 NOTE — Evaluation (Signed)
Clinical/Bedside Swallow Evaluation Patient Details  Name: Bruce [name] MRN: [id number] Date of Birth: 05-24-1932  Today's Date: 03/05/2017 Time: SLP Start Time (ACUTE ONLY): 1300 SLP Stop Time (ACUTE ONLY): 1400 SLP Time Calculation (min) (ACUTE ONLY): 60 min  Past Medical History: History reviewed. No pertinent past medical history. Past Surgical History: History reviewed. No pertinent surgical history. HPI:  Pt is an 84 y.o. male with a history of Dementia who was walking with wife on yesterday and decided that he wanted to run.  Patient fell at that time.  Initially refused to come to the hospital.  Wife noted that he was more confused and was incontinent overnight and was able to convince him to come to the hospital today.  In the past week was placed on Elavil.  Feels has been going downhill quicker since that time. Head CT reviewed and shows a right parieto-occipital SDH/SAH.    Assessment / Plan / Recommendation Clinical Impression  Pt presents w/ adequate oropharyngeal phase swallow function w/ no apparent oropharyngeal phase dysphagia w/ trials given; pt is at reduced risk for aspiration when following general aspiration precautions. Pt exhibited adequate oral phase control for bolus mastication/management and oral clearing. A-P transfer was timely. Pharyngeal swallow appeared complete w/ no overt s/s of aspiration noted w/ trials given; clear vocal quality and no decline in respiratory status noted w/ trials. Pt fed self w/ support - min discoordination when feeding self and sipping from cup but soon organized to the task - suspect impact from Dementia and acute admission/fall. Recommend a mech soft diet (Dys. level 3) w/ thin liquids; general aspiration precautions; Pills in PUREE for easier, safer swallowing d/t declined Cognitive status baseline. Recommend reducing distractions during meals but monitoring for follow through w/ general precautions secondary to the declined Cognitive  status/Dementia. No further skilled ST services indicated at this time. NSG to reconsult if any change in status while admitted. NSG updated, agreed. Pt/family agreed.  SLP Visit Diagnosis: Dysphagia, unspecified (R13.10)    Aspiration Risk  (reduced)    Diet Recommendation  Mech Soft diet w/ Thin liquids,(Dysphagia level 3); general aspiration precautions. Monitoring at meals as needed, reduce distractions at meals  Medication Administration: Whole meds with puree(for safer, easier swallowing)    Other  Recommendations Recommended Consults: (n/a) Oral Care Recommendations: Oral care BID;Patient independent with oral care;Staff/trained caregiver to provide oral care Other Recommendations: (n/a)   Follow up Recommendations None      Frequency and Duration (n/a)  (n/a)       Prognosis Prognosis for Safe Diet Advancement: Good Barriers to Reach Goals: Cognitive deficits(Dementia)      Swallow Study   General Date of Onset: 03/05/17 HPI: Pt is an 84 y.o. male with a history of Dementia who was walking with wife on yesterday and decided that he wanted to run.  Patient fell at that time.  Initially refused to come to the hospital.  Wife noted that he was more confused and was incontinent overnight and was able to convince him to come to the hospital today.  In the past week was placed on Elavil.  Feels has been going downhill quicker since that time. Head CT reviewed and shows a right parieto-occipital SDH/SAH.  Type of Study: Bedside Swallow Evaluation Previous Swallow Assessment: none Diet Prior to this Study: Regular;Thin liquids(per wife; NPO since admission) Temperature Spikes Noted: No(wbc 5.7) Respiratory Status: Room air History of Recent Intubation: No Behavior/Cognition: Alert;Cooperative;Pleasant mood;Confused;Distractible;Requires cueing Oral Cavity Assessment: Within Functional Limits;Dry  Oral Care Completed by SLP: Recent completion by staff Oral Cavity - Dentition:  Adequate natural dentition Vision: Functional for self-feeding Self-Feeding Abilities: Able to feed self;Needs assist;Needs set up(d/t weakness overall) Patient Positioning: Upright in bed Baseline Vocal Quality: Normal;Low vocal intensity(min) Volitional Cough: Strong Volitional Swallow: Able to elicit    Oral/Motor/Sensory Function Overall Oral Motor/Sensory Function: Within functional limits(grossly)   Ice Chips Ice chips: Within functional limits Presentation: Spoon(fed; 2 trials)   Thin Liquid Thin Liquid: Within functional limits Presentation: Cup;Self Fed;Straw(~6 ozs total) Other Comments: min discoordination when feeding self and sipping from cup but soon organized to the task - suspect impact from Dementia and acute admission    Nectar Thick Nectar Thick Liquid: Not tested   Honey Thick Honey Thick Liquid: Not tested   Puree Puree: Within functional limits Presentation: Self Fed;Spoon(4 ozs)   Solid   GO   Solid: Within functional limits Presentation: Self Fed(5 graham crackers) Other Comments: moistened and broken into small pieces    Functional Assessment Tool Used: clinical judgement Functional Limitations: Swallowing Swallow Current Status (W0981): At least 1 percent but less than 20 percent impaired, limited or restricted Swallow Goal Status [phone]): At least 1 percent but less than 20 percent impaired, limited or restricted Swallow Discharge Status [phone]): At least 1 percent but less than 20 percent impaired, limited or restricted   Bruce Som, MS, CCC-SLP Bruce [name] 03/05/2017,2:43 PM

## 2017-03-05 NOTE — ED Provider Notes (Signed)
[facility] Emergency Department Provider Note   First MD Initiated Contact with Patient 03/05/17 [phone]     (approximate)  I have reviewed the triage vital signs and the nursing notes.   HISTORY  Chief Complaint Fall   HPI Bruce [name] is a 84 y.o. male with below list of chronic medical conditions including Alzheimer's dementia presents to the emergency department with history of accidental fall yesterday while taking a walk with his wife.  Patient sustained a injury to the left side of the face left shoulder and left hand secondary to the fall.  Patient's wife states that the patient subsequently became entangled at home resulting in him falling onto his buttocks no head injury at that time.  She states she called paramedics because she was unable to get the patient off the floor.   Past medical history None  Patient Active Problem List   Diagnosis Date Noted  . Essential hypertension, benign 06/09/2016  . Hyperlipidemia 06/09/2016  . Diabetes (HCC) 06/09/2016  . Carotid stenosis 06/09/2016   Past surgical history None  Prior to Admission medications   Medication Sig Start Date End Date Taking? Authorizing Provider  amitriptyline (ELAVIL) 25 MG tablet Take 25 mg by mouth at bedtime.   Yes [provider]  aspirin EC 81 MG tablet Take 1 tablet by mouth daily.   Yes [provider]  diphenhydrAMINE (BENADRYL) 25 mg capsule Take 1 capsule by mouth every 6 (six) hours as needed.   Yes [provider]  docusate sodium (COLACE) 100 MG capsule Take 1 capsule by mouth 2 (two) times daily.   Yes [provider]  doxazosin (CARDURA) 4 MG tablet Take 1 tablet by mouth daily. 05/24/15  Yes [provider]  glimepiride (AMARYL) 2 MG tablet Take 1 tablet by mouth daily. 01/14/16  Yes [provider]  ibuprofen (ADVIL,MOTRIN) 200 MG tablet Take 1 tablet by mouth every 6 (six) hours as needed.   Yes [provider]  lisinopril (PRINIVIL,ZESTRIL) 20 MG tablet Take 1 tablet by mouth daily. 11/20/15  Yes [provider]  memantine (NAMENDA) 5 MG tablet Take 1 tablet by mouth 2 (two) times daily. 01/14/16  Yes [provider]  metFORMIN (GLUCOPHAGE) 500 MG tablet Take 1 tablet by mouth 2 (two) times daily. 11/19/15  Yes [provider]  Multiple Vitamin (MULTI-VITAMINS) TABS Take 1 tablet by mouth daily.   Yes [provider]  omeprazole (PRILOSEC) 20 MG capsule Take 1 capsule by mouth daily.   Yes [provider]  simvastatin (ZOCOR) 20 MG tablet Take 1 tablet by mouth daily. 12/11/15  Yes [provider]    Allergies No known drug allergies   Social History Social History   Tobacco Use  . Smoking status: Never Smoker  . Smokeless tobacco: Never Used  Substance Use Topics  . Alcohol use: Yes  . Drug use: Not on file    Review of Systems Constitutional: No fever/chills Eyes: No visual changes. ENT: No sore throat. Cardiovascular: Denies chest pain. Respiratory: Denies shortness of breath. Gastrointestinal: No abdominal pain.  No nausea, no vomiting.  No diarrhea.  No constipation. Genitourinary: Negative for dysuria. Musculoskeletal: Negative for neck pain.  Negative for back pain. Integumentary: Today for facial abrasions left shoulder.  Pain and swelling Neurological: Negative for headaches, focal weakness or numbness.   ____________________________________________   PHYSICAL EXAM:  VITAL SIGNS: ED Triage Vitals  Enc Vitals Group     BP  03/05/17 0529 (!) 142/103     Pulse Rate 03/05/17 0529 93     Resp 03/05/17 0529 20     Temp 03/05/17 0529 98.5 F (36.9 C)     Temp Source 03/05/17 0529 Oral     SpO2 03/05/17 0529 95 %     Weight 03/05/17 0531 63.5 kg (140 lb)     Height 03/05/17 0531 1.727 m (5\' 8" )     Head Circumference --      Peak Flow --      Pain Score 03/05/17 0530 3     Pain Loc --      Pain Edu?  --      Excl. in GC? --     Constitutional: Alert and . Well appearing and in no acute distress. Eyes: Conjunctivae are normal. PERRL. EOMI. Head: Left infraorbital abrasion in the area of the left ZMC associated contusion Nose: No congestion/rhinnorhea. Mouth/Throat: Mucous membranes are moist. Oropharynx non-erythematous. Neck: No stridor.  No pain with cervical palpation no pain with cervical range of motion  Cardiovascular: Normal rate, regular rhythm. Good peripheral circulation. Grossly normal heart sounds. Respiratory: Normal respiratory effort.  No retractions. Lungs CTAB. Gastrointestinal: Soft and nontender. No distention.  Musculoskeletal: Abrasion/contusion left shoulder laterally.  Ecchymoses swelling dorsal aspect of the left hand. Neurologic:  Normal speech and language. No gross focal neurologic deficits are appreciated.  Skin:  Skin is warm, dry and intact. No rash noted. Psychiatric: Mood and affect are normal. Speech and behavior are normal.  ____________________________________________   LABS (all labs ordered are listed, but only abnormal results are displayed)  Labs Reviewed  CBC  COMPREHENSIVE METABOLIC PANEL  TROPONIN I  PROTIME-INR   ____________________________________________  EKG   ____________________________________________  RADIOLOGY I, [place] Dewayne Shorter, personally viewed and evaluated these images (plain radiographs) as part of my medical decision making, as well as reviewing the written report by the radiologist.    Official radiology report(s): Ct Head Wo Contrast  Result Date: 03/05/2017 CLINICAL DATA:  84 year old male with fall and facial trauma EXAM: CT HEAD WITHOUT CONTRAST CT MAXILLOFACIAL WITHOUT CONTRAST TECHNIQUE: Multidetector CT imaging of the head and maxillofacial structures were performed using the standard protocol without intravenous contrast. Multiplanar CT image reconstructions of the maxillofacial structures were also  generated. 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 There is a small amount of extra-axial hemorrhage along the right posterior parietal lobe adjacent to the midline, likely combination of small subdural and subarachnoid bleed and measures up to 8 mm (sagittal series 9, image 26). Small amount of subarachnoid hemorrhage is also noted in the right occipital lobe. There is no mass effect or midline shift. Vascular: No hyperdense vessel or unexpected calcification. Skull: Normal. Negative for fracture or focal lesion. Other: None CT MAXILLOFACIAL FINDINGS Osseous: There is no acute fracture or dislocation. Arthritic changes of the TMJ bilaterally. Orbits: The globes and retro-orbital fat are preserved Sinuses: Mild diffuse mucoperiosteal thickening of paranasal sinuses. Small bilateral maxillary sinus retention cysts or polyps. No air-fluid levels. Soft tissues: Left facial soft tissue contusion.  No large hematoma. IMPRESSION: 1. Right parietal and occipital extra-axial hemorrhage, likely combination of subdural and subarachnoid hemorrhage. No mass effect or midline shift. 2. No acute/traumatic facial bone fractures. These results were called by telephone at the time  of interpretation on 03/05/2017 at 6:29 am to Dr. Bayard Males , who verbally acknowledged these results. Electronically Signed   By: Elgie Collard M.D.   On: 03/05/2017 06:36   Dg Humerus Left  Result Date: 03/05/2017 CLINICAL DATA:  84 year old male with fall and left upper extremity pain. EXAM: LEFT HUMERUS - 2+ VIEW; LEFT HAND - COMPLETE 3+ VIEW COMPARISON:  None. FINDINGS: There is no acute fracture or dislocation. The bones are osteopenic. Areas of linear lucency the scapula, likely chronic changes and  related to demineralization. There is soft tissue swelling of the dorsum of the hand. No radiopaque foreign object or soft tissue gas. IMPRESSION: 1. No acute fracture or dislocation. 2. Soft tissue swelling of the hand. Clinical correlation is recommended. Electronically Signed   By: Elgie Collard M.D.   On: 03/05/2017 06:13   Dg Hand Complete Left  Result Date: 03/05/2017 CLINICAL DATA:  84 year old male with fall and left upper extremity pain. EXAM: LEFT HUMERUS - 2+ VIEW; LEFT HAND - COMPLETE 3+ VIEW COMPARISON:  None. FINDINGS: There is no acute fracture or dislocation. The bones are osteopenic. Areas of linear lucency the scapula, likely chronic changes and related to demineralization. There is soft tissue swelling of the dorsum of the hand. No radiopaque foreign object or soft tissue gas. IMPRESSION: 1. No acute fracture or dislocation. 2. Soft tissue swelling of the hand. Clinical correlation is recommended. Electronically Signed   By: Elgie Collard M.D.   On: 03/05/2017 06:13   Ct Maxillofacial Wo Contrast  Result Date: 03/05/2017 CLINICAL DATA:  84 year old male with fall and facial trauma EXAM: CT HEAD WITHOUT CONTRAST CT MAXILLOFACIAL WITHOUT CONTRAST TECHNIQUE: Multidetector CT imaging of the head and maxillofacial structures were performed using the standard protocol without intravenous contrast. Multiplanar CT image reconstructions of the maxillofacial structures were also generated. 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 There is a small amount of extra-axial hemorrhage along the right posterior parietal lobe adjacent to the midline, likely combination of small subdural and subarachnoid bleed and measures up to 8 mm (sagittal series 9, image 26).  Small amount of subarachnoid hemorrhage is also noted in the right occipital lobe. There is no mass effect or midline shift. Vascular: No hyperdense vessel or unexpected calcification. Skull: Normal. Negative for fracture or focal lesion. Other: None CT MAXILLOFACIAL FINDINGS Osseous: There is no acute fracture or dislocation. Arthritic changes of the TMJ bilaterally. Orbits: The globes and retro-orbital fat are preserved Sinuses: Mild diffuse mucoperiosteal thickening of paranasal sinuses. Small bilateral maxillary sinus retention cysts or polyps. No air-fluid levels. Soft tissues: Left facial soft tissue contusion.  No large hematoma. IMPRESSION: 1. Right parietal and occipital extra-axial hemorrhage, likely combination of subdural and subarachnoid hemorrhage. No mass effect or midline shift. 2. No acute/traumatic facial bone fractures. These results were called by telephone at the time of interpretation on 03/05/2017 at 6:29 am to Dr. Bayard Males , who verbally acknowledged these results. Electronically Signed   By: Elgie Collard M.D.   On: 03/05/2017 06:36      Procedures   ____________________________________________   INITIAL IMPRESSION / ASSESSMENT AND PLAN / ED COURSE  As part of my medical decision making, I reviewed the following data within the electronic MEDICAL RECORD NUMBER   84 year old male presented with above-stated  history secondary to accidental fall.  CT scan revealed small occipital subdural/subarachnoid hemorrhage.  As such patient discussed with [facility] neurosurgery Dr. Lucianne Muss who states that the patient does not require neurosurgical admission.  As such patient discussed with Dr. Sheryle Hail hospitalist here to [facility] for admission for further evaluation and management.     ____________________________________________  FINAL CLINICAL IMPRESSION(S) / ED DIAGNOSES  Final diagnoses:  Subdural occipital hemorrhage (HCC)     MEDICATIONS GIVEN DURING THIS  VISIT:  Medications - No data to display   ED Discharge Orders    None       Note:  This document was prepared using Dragon voice recognition software and may include unintentional dictation errors.    Darci Current, MD 03/05/17 [phone]

## 2017-03-05 NOTE — ED Notes (Signed)
[place] Pt resting comfortably with family at bedside. Pt states he has no pain, but when reminded, states he has "a little" in his right arm and shoulder. Wife states this is from the fall, pt states "must be arthritis, I guess." Pt alert & oriented to self. NAD Noted.

## 2017-03-05 NOTE — ED Notes (Signed)
Wife and sister in law here

## 2017-03-05 NOTE — H&P (Signed)
[facility] - [place] [facility]   PATIENT NAME: Bruce [name]    MR#:  [id number]  DATE OF BIRTH:  18-Oct-1932  DATE OF ADMISSION:  03/05/2017  PRIMARY CARE PHYSICIAN: [name], MD   REQUESTING/REFERRING PHYSICIAN:   CHIEF COMPLAINT:   Chief Complaint  Patient presents with  . Fall    HISTORY OF PRESENT ILLNESS:  Bruce [name]  is a 84 y.o. male with a known history of essential hypertension, severe Alzheimer's dementia had a fall at home brought by the wife and CAT scan of the head showed small subdural hematoma on the right side, ER physician spoke to neurosurgery physician at [facility] he recommended hospitalist admission here but without any further neurosurgical intervention.  Patient wife is aware of this.  Patient has she also has dementia but according to wife he is more confused than usual since yesterday.  They both went out for a walk yesterday afternoon and he had a fall and bruised his right shoulder and he fell on the right side.  Patient noted to have bruise on the right shoulder and also right knee and was unable to help with her to get him up to car.  At home she noticed that he is more confused than usual.  PAST MEDICAL HISTORY:  History reviewed. No pertinent past medical history. Past medical history significant for hypertension, diabetes mellitus type 2, dementia, essential hypertension, thrombocytopenia, allergic rhinitis, BPH. PAST SURGICAL HISTOIRY:  History reviewed. No pertinent surgical history. Past surgical history significant for brain surgery for hematoma, carotid endarterectomy on the right side, history of CABG. SOCIAL HISTORY:   Social History   Tobacco Use  . Smoking status: Never Smoker  . Smokeless tobacco: Never Used  Substance Use Topics  . Alcohol use: Yes    FAMILY HISTORY:  History reviewed. No pertinent family history. No family history of hypertension or diabetes. DRUG ALLERGIES:  No Known  Allergies  REVIEW OF SYSTEMS:  unAble to obtain review of systems due to dementia.  Both parents had dementia  MEDICATIONS AT HOME:   Prior to Admission medications   Medication Sig Start Date End Date Taking? Authorizing Provider  amitriptyline (ELAVIL) 25 MG tablet Take 25 mg by mouth at bedtime.   Yes [provider]  aspirin EC 81 MG tablet Take 1 tablet by mouth daily.   Yes [provider]  diphenhydrAMINE (BENADRYL) 25 mg capsule Take 1 capsule by mouth every 6 (six) hours as needed.   Yes [provider]  docusate sodium (COLACE) 100 MG capsule Take 1 capsule by mouth 2 (two) times daily.   Yes [provider]  doxazosin (CARDURA) 4 MG tablet Take 1 tablet by mouth daily. 05/24/15  Yes [provider]  glimepiride (AMARYL) 2 MG tablet Take 1 tablet by mouth daily. 01/14/16  Yes [provider]  ibuprofen (ADVIL,MOTRIN) 200 MG tablet Take 1 tablet by mouth every 6 (six) hours as needed.   Yes [provider]  lisinopril (PRINIVIL,ZESTRIL) 20 MG tablet Take 1 tablet by mouth daily. 11/20/15  Yes [provider]  memantine (NAMENDA) 5 MG tablet Take 1 tablet by mouth 2 (two) times daily. 01/14/16  Yes [provider]  metFORMIN (GLUCOPHAGE) 500 MG tablet Take 1 tablet by mouth 2 (two) times daily. 11/19/15  Yes [provider]  Multiple Vitamin (MULTI-VITAMINS) TABS Take 1 tablet by mouth daily.   Yes [provider]  omeprazole (PRILOSEC) 20 MG capsule Take 1  capsule by mouth daily.   Yes [provider]  simvastatin (ZOCOR) 20 MG tablet Take 1 tablet by mouth daily. 12/11/15  Yes [provider]      VITAL SIGNS:  Blood pressure 124/66, pulse 68, temperature 98.5 F (36.9 C), temperature source Oral, resp. rate 20, height 5\' 8"  (1.727 m), weight 63.5 kg (140 lb), SpO2 96 %.  PHYSICAL EXAMINATION:  GENERAL:  84 y.o.-year-old patient lying in the bed with no acute distress.   as bandages on the right side of the face.  Noted to have scrapes on the right shoulder and also right knee. EYES: Pupils equal, round, reactive to light and accommodation. No scleral icterus. Extraocular muscles intact.  HEENT: Head atraumatic, normocephalic. Oropharynx and nasopharynx clear.  NECK:  Supple, no jugular venous distention. No thyroid enlargement, no tenderness.  LUNGS: Normal breath sounds bilaterally, no wheezing, rales,rhonchi or crepitation. No use of accessory muscles of respiration.  CARDIOVASCULAR: S1, S2 normal. No murmurs, rubs, or gallops.  ABDOMEN: Soft, nontender, nondistended. Bowel sounds present. No organomegaly or mass.  EXTREMITIES: No pedal edema, cyanosis, or clubbing.  NEUROLOGIC: Power 5 of the upper and lower extremities. SKIN: No obvious rash, lesion, or ulcer.   LABORATORY PANEL:   CBC Recent Labs  Lab 03/05/17 0635  WBC 6.4  HGB 14.3  HCT 42.6  PLT 117*   ------------------------------------------------------------------------------------------------------------------  Chemistries  Recent Labs  Lab 03/05/17 0635  NA 136  K 4.2  CL 104  CO2 23  GLUCOSE 161*  BUN 26*  CREATININE 1.24  CALCIUM 8.8*  AST 28  ALT 20  ALKPHOS 47  BILITOT 0.6   ------------------------------------------------------------------------------------------------------------------  Cardiac Enzymes Recent Labs  Lab 03/05/17 0635  TROPONINI <0.03   ------------------------------------------------------------------------------------------------------------------  RADIOLOGY:  Ct Head Wo Contrast  Result Date: 03/05/2017 CLINICAL DATA:  84 year old male with fall and facial trauma EXAM: CT HEAD WITHOUT CONTRAST CT MAXILLOFACIAL WITHOUT CONTRAST TECHNIQUE: Multidetector CT imaging of the head and maxillofacial structures were performed using the standard protocol without intravenous contrast. Multiplanar CT image reconstructions of the maxillofacial  structures were also generated. 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 There is a small amount of extra-axial hemorrhage along the right posterior parietal lobe adjacent to the midline, likely combination of small subdural and subarachnoid bleed and measures up to 8 mm (sagittal series 9, image 26). Small amount of subarachnoid hemorrhage is also noted in the right occipital lobe. There is no mass effect or midline shift. Vascular: No hyperdense vessel or unexpected calcification. Skull: Normal. Negative for fracture or focal lesion. Other: None CT MAXILLOFACIAL FINDINGS Osseous: There is no acute fracture or dislocation. Arthritic changes of the TMJ bilaterally. Orbits: The globes and retro-orbital fat are preserved Sinuses: Mild diffuse mucoperiosteal thickening of paranasal sinuses. Small bilateral maxillary sinus retention cysts or polyps. No air-fluid levels. Soft tissues: Left facial soft tissue contusion.  No large hematoma. IMPRESSION: 1. Right parietal and occipital extra-axial hemorrhage, likely combination of subdural and subarachnoid hemorrhage. No mass effect or midline shift. 2. No acute/traumatic facial bone fractures. These results were called by telephone at the time of interpretation on 03/05/2017 at 6:29 am to Dr. Bayard Males , who verbally acknowledged these results. Electronically Signed   By: Elgie Collard M.D.   On: 03/05/2017 06:36   Dg  Humerus Left  Result Date: 03/05/2017 CLINICAL DATA:  84 year old male with fall and left upper extremity pain. EXAM: LEFT HUMERUS - 2+ VIEW; LEFT HAND - COMPLETE 3+ VIEW COMPARISON:  None. FINDINGS: There is no acute fracture or dislocation. The bones are osteopenic. Areas of linear lucency the scapula, likely  chronic changes and related to demineralization. There is soft tissue swelling of the dorsum of the hand. No radiopaque foreign object or soft tissue gas. IMPRESSION: 1. No acute fracture or dislocation. 2. Soft tissue swelling of the hand. Clinical correlation is recommended. Electronically Signed   By: Elgie CollardArash  Radparvar M.D.   On: 03/05/2017 06:13   Dg Hand Complete Left  Result Date: 03/05/2017 CLINICAL DATA:  84 year old male with fall and left upper extremity pain. EXAM: LEFT HUMERUS - 2+ VIEW; LEFT HAND - COMPLETE 3+ VIEW COMPARISON:  None. FINDINGS: There is no acute fracture or dislocation. The bones are osteopenic. Areas of linear lucency the scapula, likely chronic changes and related to demineralization. There is soft tissue swelling of the dorsum of the hand. No radiopaque foreign object or soft tissue gas. IMPRESSION: 1. No acute fracture or dislocation. 2. Soft tissue swelling of the hand. Clinical correlation is recommended. Electronically Signed   By: Elgie CollardArash  Radparvar M.D.   On: 03/05/2017 06:13   Ct Maxillofacial Wo Contrast  Result Date: 03/05/2017 CLINICAL DATA:  84 year old male with fall and facial trauma EXAM: CT HEAD WITHOUT CONTRAST CT MAXILLOFACIAL WITHOUT CONTRAST TECHNIQUE: Multidetector CT imaging of the head and maxillofacial structures were performed using the standard protocol without intravenous contrast. Multiplanar CT image reconstructions of the maxillofacial structures were also generated. 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 There is a small amount of extra-axial hemorrhage along the right posterior parietal lobe adjacent to the midline, likely combination of small subdural and subarachnoid bleed and measures up to 8 mm (sagittal  series 9, image 26). Small amount of subarachnoid hemorrhage is also noted in the right occipital lobe. There is no mass effect or midline shift. Vascular: No hyperdense vessel or unexpected calcification. Skull: Normal. Negative for fracture or focal lesion. Other: None CT MAXILLOFACIAL FINDINGS Osseous: There is no acute fracture or dislocation. Arthritic changes of the TMJ bilaterally. Orbits: The globes and retro-orbital fat are preserved Sinuses: Mild diffuse mucoperiosteal thickening of paranasal sinuses. Small bilateral maxillary sinus retention cysts or polyps. No air-fluid levels. Soft tissues: Left facial soft tissue contusion.  No large hematoma. IMPRESSION: 1. Right parietal and occipital extra-axial hemorrhage, likely combination of subdural and subarachnoid hemorrhage. No mass effect or midline shift. 2. No acute/traumatic facial bone fractures. These results were called by telephone at the time of interpretation on 03/05/2017 at 6:29 am to Dr. Bayard MalesANDOLPH BROWN , who verbally acknowledged these results. Electronically Signed   By: Elgie CollardArash  Radparvar M.D.   On: 03/05/2017 06:36    EKG:   Orders placed or performed during the hospital encounter of 02/06/16  . ED EKG  . ED EKG  . EKG  . EKG 12-Lead  . EKG 12-Lead    IMPRESSION AND PLAN:  84 year old male patient with Alzheimer's dementia and follows up with Dr. Clydia LlanoHemang had a fall at home. 1.  Small subdural subarachnoid hemorrhage on the right side without any midline shift that is secondary to fall.  Hold  aspirin at this time, monitor closely, obtain neurology consult, physical therapy consult, repeat CT in 24 hours, speech therapy also is consulted.  Discussed the plan with patient's wife.  Obtain neuro checks every 4 hours.  Start with n.p.o. and get speech therapy to see the patient and start on the diet.  According to wife patient is able to feed himself and also walks without any support like a walker.  2.  Diabetes mellitus type 2:  Continue sliding scale with coverage, hold metformin, Amaryl at this time  3.  Essential hypertension: Start on lisinopril. 4.  BPH: Continue Flomax 0.4 mg daily. Alzheimer's dementia: Patient is on Namenda, continue that. CODE STATUS is DNR discussed with wife.   All the records are reviewed and case discussed with ED provider. Management plans discussed with the patient, family and they are in agreement.  CODE STATUS: DNR  TOTAL TIME TAKING CARE OF THIS PATIENT: 5 .    [name] M.D on 03/05/2017 at 8:55 AM  Between 7am to 6pm - Pager - [phone]  After 6pm go to [email or web address] - password [email or web address] [facility]  [place] [facility]  Office  [phone]  CC: Primary care physician; [name], MD  Note: This dictation was prepared with Dragon dictation along with smaller phrase technology. Any transcriptional errors that result from this process are unintentional.

## 2017-03-05 NOTE — Progress Notes (Signed)
PT Cancellation Note  Patient Details Name: Bruce [name] MRN: [id number] DOB: 11-Jun-1932   Cancelled Treatment:    Reason Eval/Treat Not Completed: Patient not medically ready.  Pt admitted s/p fall with subdural hemorrhage.  Pending neuro consult.  Will hold PT until neuro consult complete and POC established.      Encarnacion ChuAshley Abashian PT, DPT 03/05/2017, 11:25 AM

## 2017-03-05 NOTE — ED Notes (Signed)
Pt assisted with urinal; stood at bedside

## 2017-03-05 NOTE — ED Triage Notes (Signed)
Per EMS pt fell 2 x yesterday. Once early in the day on a walk and again later in the day in the house per wifes description. Pt has dementia and Alzheimer disease. Pt does not know when he fell and is a poor historian. Wife will be up to the hospital later today

## 2017-03-05 NOTE — ED Notes (Signed)
Pt fell yesterday (2/21) and injured L shoulder and wrist. Pain 3/10. Cannot rotate shoulder around when asked.

## 2017-03-06 ENCOUNTER — Observation Stay: Payer: Medicare Other

## 2017-03-06 DIAGNOSIS — S065X9A Traumatic subdural hemorrhage with loss of consciousness of unspecified duration, initial encounter: Secondary | ICD-10-CM | POA: Diagnosis not present

## 2017-03-06 DIAGNOSIS — I6200 Nontraumatic subdural hemorrhage, unspecified: Secondary | ICD-10-CM | POA: Diagnosis not present

## 2017-03-06 LAB — CBC
HCT: 42.7 % (ref 40.0–52.0)
Hemoglobin: 14.3 g/dL (ref 13.0–18.0)
MCH: 30.6 pg (ref 26.0–34.0)
MCHC: 33.5 g/dL (ref 32.0–36.0)
MCV: 91.4 fL (ref 80.0–100.0)
PLATELETS: 104 10*3/uL — AB (ref 150–440)
RBC: 4.67 MIL/uL (ref 4.40–5.90)
RDW: 13.2 % (ref 11.5–14.5)
WBC: 5.4 10*3/uL (ref 3.8–10.6)

## 2017-03-06 LAB — BASIC METABOLIC PANEL
Anion gap: 7 (ref 5–15)
BUN: 21 mg/dL — AB (ref 6–20)
CHLORIDE: 107 mmol/L (ref 101–111)
CO2: 21 mmol/L — ABNORMAL LOW (ref 22–32)
CREATININE: 1 mg/dL (ref 0.61–1.24)
Calcium: 8.5 mg/dL — ABNORMAL LOW (ref 8.9–10.3)
GFR calc Af Amer: >60 mL/min (ref >60)
GFR calc non Af Amer: >60 mL/min (ref >60)
GLUCOSE: 167 mg/dL — AB (ref 65–99)
Potassium: 4.2 mmol/L (ref 3.5–5.1)
SODIUM: 135 mmol/L (ref 135–145)

## 2017-03-06 LAB — GLUCOSE, CAPILLARY
GLUCOSE-CAPILLARY: 118 mg/dL — AB (ref 65–99)
Glucose-Capillary: 119 mg/dL — ABNORMAL HIGH (ref 65–99)
Glucose-Capillary: 318 mg/dL — ABNORMAL HIGH (ref 65–99)
Glucose-Capillary: 131 mg/dL — ABNORMAL HIGH (ref 65–99)

## 2017-03-06 MED ORDER — QUETIAPINE FUMARATE 25 MG PO TABS
25.0000 mg | ORAL_TABLET | Freq: Every day | ORAL | Status: DC
  Administered 2017-03-06: 25 mg via ORAL
  Filled 2017-03-06: qty 1

## 2017-03-06 MED ORDER — HALOPERIDOL LACTATE 5 MG/ML IJ SOLN
1.0000 mg | Freq: Four times a day (QID) | INTRAMUSCULAR | Status: DC | PRN
  Administered 2017-03-06 (×2): 1 mg via INTRAVENOUS
  Filled 2017-03-06 (×2): qty 1

## 2017-03-06 MED ORDER — DIPHENHYDRAMINE HCL 25 MG PO CAPS
25.0000 mg | ORAL_CAPSULE | Freq: Four times a day (QID) | ORAL | Status: DC | PRN
  Administered 2017-03-06: 17:00:00 25 mg via ORAL
  Filled 2017-03-06: qty 1

## 2017-03-06 MED ORDER — LORAZEPAM 2 MG/ML IJ SOLN: 2.0000 mg | Freq: Once | INTRAMUSCULAR | Status: DC

## 2017-03-06 NOTE — Progress Notes (Signed)
Subjective: Patient did not sleep overnight but no new neurological complaints  Objective: Current vital signs: BP (!) 128/50 (BP Location: Left Arm)   Pulse 64   Temp 98.4 F (36.9 C) (Oral)   Resp 20   Ht 5\' 7"  (1.702 m)   Wt 60.3 kg (133 lb)   SpO2 95%   BMI 20.83 kg/m  Vital signs in last 24 hours: Temp:  [98.3 F (36.8 C)-98.6 F (37 C)] 98.4 F (36.9 C) (02/23 1322) Pulse Rate:  [64-76] 64 (02/23 1322) Resp:  [16-20] 20 (02/23 1322) BP: (126-134)/(50-67) 128/50 (02/23 1322) SpO2:  [95 %-97 %] 95 % (02/23 1322) Weight:  [60.3 kg (133 lb)] 60.3 kg (133 lb) (02/23 0455)  Intake/Output from previous day: 02/22 0701 - 02/23 0700 In: 570 [P.O.:120; I.V.:450] Out: 925 [Urine:925] Intake/Output this shift: Total I/O In: 360 [P.O.:360] Out: -  Nutritional status: DIET DYS 3 Room service appropriate? Yes with Assist; Fluid consistency: Thin Aspiration precautions  Neurologic Exam: Mental Status: Awake, alert, confused.  . Cranial Nerves: II: Blinks to bilateral confrontation III,IV, VI: ptosis not present, extra-ocular motions intact bilaterally V,VII: left facial droop, facial light touch sensation normal bilaterally VIII: hearing normal bilaterally IX,X: gag reflex present XI: bilateral shoulder shrug XII: midline tongue extension Motor: Moves all extremities against gravity    Lab Results: Basic Metabolic Panel: Recent Labs  Lab 03/05/17 0635 03/05/17 0946 03/06/17 0558  NA 136  --  135  K 4.2  --  4.2  CL 104  --  107  CO2 23  --  21*  GLUCOSE 161*  --  167*  BUN 26*  --  21*  CREATININE 1.24 1.14 1.00  CALCIUM 8.8*  --  8.5*    Liver Function Tests: Recent Labs  Lab 03/05/17 0635  AST 28  ALT 20  ALKPHOS 47  BILITOT 0.6  PROT 6.7  ALBUMIN 4.1   No results for input(s): LIPASE, AMYLASE in the last 168 hours. No results for input(s): AMMONIA in the last 168 hours.  CBC: Recent Labs  Lab 03/05/17 0635 03/05/17 0946 03/06/17 0558   WBC 6.4 5.7 5.4  HGB 14.3 14.3 14.3  HCT 42.6 43.4 42.7  MCV 91.8 91.5 91.4  PLT 117* 119* 104*    Cardiac Enzymes: Recent Labs  Lab 03/05/17 0635  TROPONINI <0.03    Lipid Panel: No results for input(s): CHOL, TRIG, HDL, CHOLHDL, VLDL, LDLCALC in the last 168 hours.  CBG: Recent Labs  Lab 03/05/17 1159 03/05/17 1653 03/05/17 2105 03/06/17 0739 03/06/17 1207  GLUCAP 133* 166* 127* 119* 118*    Microbiology: No results found for this or any previous visit.  Coagulation Studies: Recent Labs    03/05/17 0635  LABPROT 13.0  INR 0.99    Imaging: Ct Head Wo Contrast  Result Date: 03/06/2017 CLINICAL DATA:  Progressive confusion. Intracranial hemorrhage. Fall. Subsequent exam. EXAM: CT HEAD WITHOUT CONTRAST TECHNIQUE: Contiguous axial images were obtained from the base of the skull through the vertex without intravenous contrast. COMPARISON:  CT head without contrast 03/05/2016. FINDINGS: Brain: Extra-axial hemorrhage posteriorly along the right parietal and occipital lobe is stable to slightly decreased. No new areas of hemorrhage are present. Advanced atrophy and diffuse white matter disease is stable. No acute cortical infarct or mass lesion is present. Brainstem and cerebellum are unremarkable. Vascular: Atherosclerotic calcifications are present within the cavernous internal carotid arteries bilaterally. There is no hyperdense vessel. Skull: Calvarium is intact. Acute or healing fracture is present.  No significant extracranial soft tissue injury is evident. Sinuses/Orbits: The paranasal sinuses and mastoid air cells are clear. IMPRESSION: 1. Stable slight decrease in size of right parietal and occipital extra-axial hemorrhage without associated fracture. 2. No new areas of hemorrhage. 3. Advanced atrophy and diffuse white matter disease likely reflects the sequela of chronic microvascular ischemia. Electronically Signed   By: Marin Robertshristopher  Mattern M.D.   On: 03/06/2017 14:07    Ct Head Wo Contrast  Result Date: 03/05/2017 CLINICAL DATA:  84 year old male with fall and facial trauma EXAM: CT HEAD WITHOUT CONTRAST CT MAXILLOFACIAL WITHOUT CONTRAST TECHNIQUE: Multidetector CT imaging of the head and maxillofacial structures were performed using the standard protocol without intravenous contrast. Multiplanar CT image reconstructions of the maxillofacial structures were also generated. 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 There is a small amount of extra-axial hemorrhage along the right posterior parietal lobe adjacent to the midline, likely combination of small subdural and subarachnoid bleed and measures up to 8 mm (sagittal series 9, image 26). Small amount of subarachnoid hemorrhage is also noted in the right occipital lobe. There is no mass effect or midline shift. Vascular: No hyperdense vessel or unexpected calcification. Skull: Normal. Negative for fracture or focal lesion. Other: None CT MAXILLOFACIAL FINDINGS Osseous: There is no acute fracture or dislocation. Arthritic changes of the TMJ bilaterally. Orbits: The globes and retro-orbital fat are preserved Sinuses: Mild diffuse mucoperiosteal thickening of paranasal sinuses. Small bilateral maxillary sinus retention cysts or polyps. No air-fluid levels. Soft tissues: Left facial soft tissue contusion.  No large hematoma. IMPRESSION: 1. Right parietal and occipital extra-axial hemorrhage, likely combination of subdural and subarachnoid hemorrhage. No mass effect or midline shift. 2. No acute/traumatic facial bone fractures. These results were called by telephone at the time of interpretation on 03/05/2017 at 6:29 am to Dr. Bayard MalesANDOLPH BROWN , who verbally acknowledged these results.  Electronically Signed   By: Elgie CollardArash  Radparvar M.D.   On: 03/05/2017 06:36   Dg Humerus Left  Result Date: 03/05/2017 CLINICAL DATA:  84 year old male with fall and left upper extremity pain. EXAM: LEFT HUMERUS - 2+ VIEW; LEFT HAND - COMPLETE 3+ VIEW COMPARISON:  None. FINDINGS: There is no acute fracture or dislocation. The bones are osteopenic. Areas of linear lucency the scapula, likely chronic changes and related to demineralization. There is soft tissue swelling of the dorsum of the hand. No radiopaque foreign object or soft tissue gas. IMPRESSION: 1. No acute fracture or dislocation. 2. Soft tissue swelling of the hand. Clinical correlation is recommended. Electronically Signed   By: Elgie CollardArash  Radparvar M.D.   On: 03/05/2017 06:13   Dg Hand Complete Left  Result Date: 03/05/2017 CLINICAL DATA:  84 year old male with fall and left upper extremity pain. EXAM: LEFT HUMERUS - 2+ VIEW; LEFT HAND - COMPLETE 3+ VIEW COMPARISON:  None. FINDINGS: There is no acute fracture or dislocation. The bones are osteopenic. Areas of linear lucency the scapula, likely chronic changes and related to demineralization. There is soft tissue swelling of the dorsum of the hand. No radiopaque foreign object or soft tissue gas. IMPRESSION: 1. No acute fracture or dislocation. 2. Soft tissue swelling of the hand. Clinical correlation is recommended. Electronically Signed   By: Elgie CollardArash  Radparvar M.D.   On: 03/05/2017 06:13   Ct Maxillofacial Wo Contrast  Result Date: 03/05/2017 CLINICAL DATA:  84 year old male with fall and facial trauma EXAM: CT HEAD WITHOUT CONTRAST CT MAXILLOFACIAL WITHOUT CONTRAST TECHNIQUE: Multidetector CT imaging of the head and maxillofacial structures were performed using the standard protocol without intravenous contrast. Multiplanar CT image reconstructions of the maxillofacial structures were also generated. COMPARISON:  Brain MRI dated 07/03/2013 FINDINGS: CT HEAD FINDINGS Brain: There is moderate to  advanced age-related atrophy and chronic microvascular ischemic changes an area low-attenuation in the right posterior parietal lobe most consistent with chronic changes and old infarct. There is a small amount of extra-axial hemorrhage along the right posterior parietal lobe adjacent to the midline, likely combination of small subdural and subarachnoid bleed and measures up to 8 mm (sagittal series 9, image 26). Small amount of subarachnoid hemorrhage is also noted in the right occipital lobe. There is no mass effect or midline shift. Vascular: No hyperdense vessel or unexpected calcification. Skull: Normal. Negative for fracture or focal lesion. Other: None CT MAXILLOFACIAL FINDINGS Osseous: There is no acute fracture or dislocation. Arthritic changes of the TMJ bilaterally. Orbits: The globes and retro-orbital fat are preserved Sinuses: Mild diffuse mucoperiosteal thickening of paranasal sinuses. Small bilateral maxillary sinus retention cysts or polyps. No air-fluid levels. Soft tissues: Left facial soft tissue contusion.  No large hematoma. IMPRESSION: 1. Right parietal and occipital extra-axial hemorrhage, likely combination of subdural and subarachnoid hemorrhage. No mass effect or midline shift. 2. No acute/traumatic facial bone fractures. These results were called by telephone at the time of interpretation on 03/05/2017 at 6:29 am to Dr. Bayard Males , who verbally acknowledged these results. Electronically Signed   By: Elgie Collard M.D.   On: 03/05/2017 06:36    Medications:  I have reviewed the patient's current medications. Scheduled: . docusate sodium  100 mg Oral BID  . insulin aspart  0-9 Units Subcutaneous TID WC  . mouth rinse  15 mL Mouth Rinse BID  . memantine  5 mg Oral BID  . ramelteon  8 mg Oral QHS  . simvastatin  20 mg Oral q1800    Assessment/Plan: No new neurological complaints.  Repeat head CT reviewed and shows mild improvement in hemorrhage.    No further neurologic  intervention is recommended at this time.  If further questions arise, please call or page at that time.  Thank you for allowing neurology to participate in the care of this patient.  Thana Farr, MD Neurology [phone] 03/06/2017  2:40 PM   LOS: 0 days

## 2017-03-06 NOTE — Evaluation (Signed)
Physical Therapy Evaluation Patient Details Name: Bruce [name] MRN: [id number] DOB: 12-29-1932 Today's Date: 03/06/2017   History of Present Illness  Patient is an 84 y/o male that presents after a fall, sustaining a closed head injury. CT showed R parietal and occipital extra-axial hemorrhage with combination of subdural and subarachnoid involvement.   Clinical Impression  Patient is an 84 y/o male that sustained closed head injury, no mid-line shift noted on CT or follow up CT. He has been confused and has had hallucinations per RN staff and in this evaluation. He was living independently prior to admission, apparently was walking with wife, tried running, then fell and hit his head. He does not demonstrate any gross neuro-muscular deficits, sensation and strength WNL in LEs. LUE is painful from fall, but does not appear to have neuro-muscular deficits. He is able to complete finger nose finger, bisect a line, and fill in a clock appropriately. However, while ambulating he initially puts foot forward, then retracts when asked to "step forward". Multiple cues required to have him ambulate forward, lifting RW throughout bouts and generally having poor insight. Patient is at a very high risk of falling again given his mentation, would benefit from short term rehab to improve his balance and safety with mobility.     Follow Up Recommendations SNF    Equipment Recommendations  Rolling walker with 5" wheels    Recommendations for Other Services       Precautions / Restrictions Precautions Precautions: Fall Restrictions Weight Bearing Restrictions: No      Mobility  Bed Mobility               General bed mobility comments: Patient in recliner as therapist arrived.   Transfers Overall transfer level: Needs assistance Equipment used: Rolling walker (2 wheeled) Transfers: Sit to/from Stand Sit to Stand: Mod assist         General transfer comment: Patient requires constant  cuing for hand placement, technique, leans posteriorly throughout transfer.   Ambulation/Gait Ambulation/Gait assistance: Mod assist Ambulation Distance (Feet): 80 Feet Assistive device: Rolling walker (2 wheeled) Gait Pattern/deviations: Wide base of support;Drifts right/left   Gait velocity interpretation: <1.8 ft/sec, indicative of risk for recurrent falls General Gait Details: Patient with very poor command following, taps feet forward and back for 30", lifts RW at multiple points in ambulation, poor insight into directions, takes hands off RW throughout bout. Decreased R stride length.   Stairs            Wheelchair Mobility    Modified Rankin (Stroke Patients Only)       Balance Overall balance assessment: Needs assistance;History of Falls Sitting-balance support: Bilateral upper extremity supported Sitting balance-Leahy Scale: Fair     Standing balance support: Bilateral upper extremity supported Standing balance-Leahy Scale: Poor                               Pertinent Vitals/Pain Pain Assessment: No/denies pain    Home Living Family/patient expects to be discharged to:: Private residence Living Arrangements: Spouse/significant other Available Help at Discharge: Family Type of Home: House                Prior Function Level of Independence: Independent               Hand Dominance        Extremity/Trunk Assessment   Upper Extremity Assessment Upper Extremity Assessment: Overall WFL for tasks assessed;LUE  deficits/detail LUE Deficits / Details: Painful secondary to fall, unable to flex without pain     Lower Extremity Assessment Lower Extremity Assessment: Overall WFL for tasks assessed       Communication   Communication: (Patient is confused, hallucinations)  Cognition Arousal/Alertness: Awake/alert Behavior During Therapy: Anxious;Restless Overall Cognitive Status: Difficult to assess Area of Impairment:  Orientation                 Orientation Level: Place;Time;Situation             General Comments: Patient is not sure where he is, has been hallucinating, noted to have paranoia about brother.       General Comments General comments (skin integrity, edema, etc.): Abrasions on L knee    Exercises     Assessment/Plan    PT Assessment Patient needs continued PT services  PT Problem List Decreased strength;Decreased safety awareness;Decreased mobility;Decreased activity tolerance;Decreased cognition;Decreased knowledge of use of DME;Decreased balance       PT Treatment Interventions Therapeutic activities;DME instruction;Gait training;Therapeutic exercise;Patient/family education;Balance training;Stair training;Functional mobility training;Neuromuscular re-education    PT Goals (Current goals can be found in the Care Plan section)  Acute Rehab PT Goals Patient Stated Goal: To improve safety with mobility.  PT Goal Formulation: With family Time For Goal Achievement: 03/20/17 Potential to Achieve Goals: Fair    Frequency 7X/week   Barriers to discharge Decreased caregiver support      Co-evaluation               AM-PAC PT "6 Clicks" Daily Activity  Outcome Measure Difficulty turning over in bed (including adjusting bedclothes, sheets and blankets)?: A Little Difficulty moving from lying on back to sitting on the side of the bed? : A Little Difficulty sitting down on and standing up from a chair with arms (e.g., wheelchair, bedside commode, etc,.)?: A Lot Help needed moving to and from a bed to chair (including a wheelchair)?: A Lot Help needed walking in hospital room?: A Lot Help needed climbing 3-5 steps with a railing? : Total 6 Click Score: 13    End of Session Equipment Utilized During Treatment: Gait belt Activity Tolerance: Patient tolerated treatment well Patient left: in chair;with call bell/phone within reach;with chair alarm set;with  family/visitor present Nurse Communication: Mobility status PT Visit Diagnosis: Unsteadiness on feet (R26.81);Muscle weakness (generalized) (M62.81);Difficulty in walking, not elsewhere classified (R26.2)    Time: [id number] PT Time Calculation (min) (ACUTE ONLY): 18 min   Charges:   PT Evaluation $PT Eval High Complexity: 1 High     PT G Codes:       Alva GarnetPatrick Glena Pharris PT, DPT, CSCS    03/06/2017, 4:44 PM

## 2017-03-06 NOTE — Progress Notes (Signed)
[facility] [place] [facility]                                                                                                                                                                                  Patient Demographics   Bruce [name], is a 84 y.o. male, DOB - [birth year], ZOX:[id number]  Admit date - 03/05/2017   Admitting Physician [name], MD  Outpatient Primary MD for the patient is [name], MD   LOS - 0  Subjective:  Pt with Alzheimer's dementia He is pleasantly confused but no other complaints   Review of Systems:   CONSTITUTIONAL: No documented fever. No fatigue, weakness. No weight gain, no weight loss.  EYES: No blurry or double vision.  ENT: No tinnitus. No postnasal drip. No redness of the oropharynx.  RESPIRATORY: No cough, no wheeze, no hemoptysis. No dyspnea.  CARDIOVASCULAR: No chest pain. No orthopnea. No palpitations. No syncope.  GASTROINTESTINAL: No nausea, no vomiting or diarrhea. No abdominal pain. No melena or hematochezia.  GENITOURINARY: No dysuria or hematuria.  ENDOCRINE: No polyuria or nocturia. No heat or cold intolerance.  HEMATOLOGY: No anemia. No bruising. No bleeding.  INTEGUMENTARY: No rashes. No lesions.  MUSCULOSKELETAL: No arthritis. No swelling. No gout.  NEUROLOGIC: No numbness, tingling, or ataxia. No seizure-type activity.  PSYCHIATRIC: No anxiety. No insomnia. No ADD.    Vitals:   Vitals:   03/05/17 2105 03/06/17 0455 03/06/17 0918 03/06/17 1322  BP: 132/67 (!) 126/53 (!) 134/59 (!) 128/50  Pulse: 76 67 72 64  Resp: 18 16 16 20   Temp: 98.4 F (36.9 C) 98.6 F (37 C) 98.3 F (36.8 C) 98.4 F (36.9 C)  TempSrc: Oral Oral Oral Oral  SpO2: 96% 97% 96% 95%  Weight:  133 lb (60.3 kg)    Height:        Wt Readings from Last 3 Encounters:  03/06/17 133 lb (60.3 kg)  06/09/16 137 lb 9.6 oz (62.4 kg)  02/06/16 135 lb (61.2 kg)     Intake/Output Summary (Last 24 hours) at  03/06/2017 1421 Last data filed at 03/06/2017 0800 Gross per 24 hour  Intake 810 ml  Output 525 ml  Net 285 ml    Physical Exam:   GENERAL: Pleasant-appearing in no apparent distress.  HEAD, EYES, EARS, NOSE AND THROAT: Atraumatic, normocephalic. Extraocular muscles are intact. Pupils equal and reactive to light. Sclerae anicteric. No conjunctival injection. No oro-pharyngeal erythema.  Laceration on left side of his face NECK: Supple. There is no jugular venous distention. No bruits, no lymphadenopathy, no thyromegaly.  HEART: Regular rate and rhythm,. No murmurs, no rubs, no clicks.  LUNGS: Clear  to auscultation bilaterally. No rales or rhonchi. No wheezes.  ABDOMEN: Soft, flat, nontender, nondistended. Has good bowel sounds. No hepatosplenomegaly appreciated.  EXTREMITIES: No evidence of any cyanosis, clubbing, or peripheral edema.  +2 pedal and radial pulses bilaterally.  NEUROLOGIC: The patient is alert, awake, and oriented x3 with no focal motor or sensory deficits appreciated bilaterally.  SKIN: Moist and warm with no rashes appreciated.  Psych: Not anxious, depressed LN: No inguinal LN enlargement    Antibiotics   Anti-infectives (From admission, onward)   None      Medications   Scheduled Meds: . docusate sodium  100 mg Oral BID  . insulin aspart  0-9 Units Subcutaneous TID WC  . mouth rinse  15 mL Mouth Rinse BID  . memantine  5 mg Oral BID  . ramelteon  8 mg Oral QHS  . simvastatin  20 mg Oral q1800   Continuous Infusions: PRN Meds:.acetaminophen **OR** acetaminophen, bisacodyl, HYDROcodone-acetaminophen, ondansetron **OR** ondansetron (ZOFRAN) IV   Data Review:   Micro Results No results found for this or any previous visit (from the past 240 hour(s)).  Radiology Reports Ct Head Wo Contrast  Result Date: 03/06/2017 CLINICAL DATA:  Progressive confusion. Intracranial hemorrhage. Fall. Subsequent exam. EXAM: CT HEAD WITHOUT CONTRAST TECHNIQUE: Contiguous  axial images were obtained from the base of the skull through the vertex without intravenous contrast. COMPARISON:  CT head without contrast 03/05/2016. FINDINGS: Brain: Extra-axial hemorrhage posteriorly along the right parietal and occipital lobe is stable to slightly decreased. No new areas of hemorrhage are present. Advanced atrophy and diffuse white matter disease is stable. No acute cortical infarct or mass lesion is present. Brainstem and cerebellum are unremarkable. Vascular: Atherosclerotic calcifications are present within the cavernous internal carotid arteries bilaterally. There is no hyperdense vessel. Skull: Calvarium is intact. Acute or healing fracture is present. No significant extracranial soft tissue injury is evident. Sinuses/Orbits: The paranasal sinuses and mastoid air cells are clear. IMPRESSION: 1. Stable slight decrease in size of right parietal and occipital extra-axial hemorrhage without associated fracture. 2. No new areas of hemorrhage. 3. Advanced atrophy and diffuse white matter disease likely reflects the sequela of chronic microvascular ischemia. Electronically Signed   By: Marin Roberts M.D.   On: 03/06/2017 14:07   Ct Head Wo Contrast  Result Date: 03/05/2017 CLINICAL DATA:  84 year old male with fall and facial trauma EXAM: CT HEAD WITHOUT CONTRAST CT MAXILLOFACIAL WITHOUT CONTRAST TECHNIQUE: Multidetector CT imaging of the head and maxillofacial structures were performed using the standard protocol without intravenous contrast. Multiplanar CT image reconstructions of the maxillofacial structures were also generated. 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 There is a small amount of extra-axial hemorrhage along the right posterior  parietal lobe adjacent to the midline, likely combination of small subdural and subarachnoid bleed and measures up to 8 mm (sagittal series 9, image 26). Small amount of subarachnoid hemorrhage is also noted in the right occipital lobe. There is no mass effect or midline shift. Vascular: No hyperdense vessel or unexpected calcification. Skull: Normal. Negative for fracture or focal lesion. Other: None CT MAXILLOFACIAL FINDINGS Osseous: There is no acute fracture or dislocation. Arthritic changes of the TMJ bilaterally. Orbits: The globes and retro-orbital fat are preserved Sinuses: Mild diffuse mucoperiosteal thickening of paranasal sinuses. Small bilateral maxillary sinus retention  cysts or polyps. No air-fluid levels. Soft tissues: Left facial soft tissue contusion.  No large hematoma. IMPRESSION: 1. Right parietal and occipital extra-axial hemorrhage, likely combination of subdural and subarachnoid hemorrhage. No mass effect or midline shift. 2. No acute/traumatic facial bone fractures. These results were called by telephone at the time of interpretation on 03/05/2017 at 6:29 am to Dr. Bayard MalesANDOLPH BROWN , who verbally acknowledged these results. Electronically Signed   By: Elgie CollardArash  Radparvar M.D.   On: 03/05/2017 06:36   Dg Humerus Left  Result Date: 03/05/2017 CLINICAL DATA:  84 year old male with fall and left upper extremity pain. EXAM: LEFT HUMERUS - 2+ VIEW; LEFT HAND - COMPLETE 3+ VIEW COMPARISON:  None. FINDINGS: There is no acute fracture or dislocation. The bones are osteopenic. Areas of linear lucency the scapula, likely chronic changes and related to demineralization. There is soft tissue swelling of the dorsum of the hand. No radiopaque foreign object or soft tissue gas. IMPRESSION: 1. No acute fracture or dislocation. 2. Soft tissue swelling of the hand. Clinical correlation is recommended. Electronically Signed   By: Elgie CollardArash  Radparvar M.D.   On: 03/05/2017 06:13   Dg Hand Complete Left  Result  Date: 03/05/2017 CLINICAL DATA:  84 year old male with fall and left upper extremity pain. EXAM: LEFT HUMERUS - 2+ VIEW; LEFT HAND - COMPLETE 3+ VIEW COMPARISON:  None. FINDINGS: There is no acute fracture or dislocation. The bones are osteopenic. Areas of linear lucency the scapula, likely chronic changes and related to demineralization. There is soft tissue swelling of the dorsum of the hand. No radiopaque foreign object or soft tissue gas. IMPRESSION: 1. No acute fracture or dislocation. 2. Soft tissue swelling of the hand. Clinical correlation is recommended. Electronically Signed   By: Elgie CollardArash  Radparvar M.D.   On: 03/05/2017 06:13   Ct Maxillofacial Wo Contrast  Result Date: 03/05/2017 CLINICAL DATA:  84 year old male with fall and facial trauma EXAM: CT HEAD WITHOUT CONTRAST CT MAXILLOFACIAL WITHOUT CONTRAST TECHNIQUE: Multidetector CT imaging of the head and maxillofacial structures were performed using the standard protocol without intravenous contrast. Multiplanar CT image reconstructions of the maxillofacial structures were also generated. 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 There is a small amount of extra-axial hemorrhage along the right posterior parietal lobe adjacent to the midline, likely combination of small subdural and subarachnoid bleed and measures up to 8 mm (sagittal series 9, image 26). Small amount of subarachnoid hemorrhage is also noted in the right occipital lobe. There is no mass effect or midline shift. Vascular: No hyperdense vessel or unexpected calcification. Skull: Normal. Negative for fracture or focal lesion. Other: None CT MAXILLOFACIAL FINDINGS Osseous: There is no acute fracture or dislocation. Arthritic changes of the TMJ  bilaterally. Orbits: The globes and retro-orbital fat are preserved Sinuses: Mild diffuse mucoperiosteal thickening of paranasal sinuses. Small bilateral maxillary sinus retention cysts or polyps. No air-fluid levels. Soft tissues: Left facial soft tissue contusion.  No large hematoma. IMPRESSION: 1. Right parietal and occipital extra-axial hemorrhage, likely combination of subdural and subarachnoid hemorrhage. No mass effect or midline shift. 2. No acute/traumatic facial bone fractures. These results were called by telephone at the time of interpretation on 03/05/2017 at 6:29 am to Dr. Bayard MalesANDOLPH BROWN , who verbally acknowledged these results. Electronically Signed   By: Burtis JunesArash  Radparvar M.D.   On: 03/05/2017 06:36     CBC Recent Labs  Lab 03/05/17 0635 03/05/17 0946 03/06/17 0558  WBC 6.4 5.7 5.4  HGB 14.3 14.3 14.3  HCT 42.6 43.4 42.7  PLT 117* 119* 104*  MCV 91.8 91.5 91.4  MCH 30.7 30.0 30.6  MCHC 33.4 32.8 33.5  RDW 13.1 13.3 13.2    Chemistries  Recent Labs  Lab 03/05/17 0635 03/05/17 0946 03/06/17 0558  NA 136  --  135  K 4.2  --  4.2  CL 104  --  107  CO2 23  --  21*  GLUCOSE 161*  --  167*  BUN 26*  --  21*  CREATININE 1.24 1.14 1.00  CALCIUM 8.8*  --  8.5*  AST 28  --   --   ALT 20  --   --   ALKPHOS 47  --   --   BILITOT 0.6  --   --    ------------------------------------------------------------------------------------------------------------------ estimated creatinine clearance is 48.6 mL/min (by C-G formula based on SCr of 1 mg/dL). ------------------------------------------------------------------------------------------------------------------ No results for input(s): HGBA1C in the last 72 hours. ------------------------------------------------------------------------------------------------------------------ No results for input(s): CHOL, HDL, LDLCALC, TRIG, CHOLHDL, LDLDIRECT in the last 72  hours. ------------------------------------------------------------------------------------------------------------------ No results for input(s): TSH, T4TOTAL, T3FREE, THYROIDAB in the last 72 hours.  Invalid input(s): FREET3 ------------------------------------------------------------------------------------------------------------------ No results for input(s): VITAMINB12, FOLATE, FERRITIN, TIBC, IRON, RETICCTPCT in the last 72 hours.  Coagulation profile Recent Labs  Lab 03/05/17 0635  INR 0.99    No results for input(s): DDIMER in the last 72 hours.  Cardiac Enzymes Recent Labs  Lab 03/05/17 0635  TROPONINI <0.03   ------------------------------------------------------------------------------------------------------------------ Invalid input(s): POCBNP    Assessment & Plan   84 year old male patient with Alzheimer's dementia and follows up with Dr. Clydia Llano had a fall at home. 1.  Small subdural subarachnoid hemorrhage on the right side without any midline shift that is secondary to fall.   Repeat CT scan shows decrease in the size of the subdural hematoma PT pending 2.  Diabetes mellitus type 2: Continue sliding scale with coverage, can resume his home medication 3.  Essential hypertension: Blood pressure stable continue lisinopril 4.  BPH: Continue Flomax 0.4 mg daily. 5. Alzheimer's dementia: Patient is on Namenda, continue that.       Code Status Orders  (From admission, onward)        Start     Ordered   03/05/17 0847  Do not attempt resuscitation (DNR)  Continuous    Question Answer Comment  In the event of cardiac or respiratory ARREST Do not call a "code blue"   In the event of cardiac or respiratory ARREST Do not perform Intubation, CPR, defibrillation or ACLS   In the event of cardiac or respiratory ARREST Use medication by any route, position, wound care, and other measures to relive pain and suffering. May use oxygen, suction and manual treatment of  airway obstruction as needed for comfort.   Comments NMP      03/05/17 0850    Code Status History    Date Active Date Inactive Code Status Order ID Comments User Context   This patient has a current code status but no historical code status.    Advance Directive Documentation     Most Recent Value  Type of Advance Directive  Healthcare Power of Attorney, Living will  Pre-existing out of facility DNR order (yellow form or pink MOST form)  No data  "MOST" Form in  Place?  No data           Consultsneuro  DVT Prophylaxis  scd;s  Lab Results  Component Value Date   PLT 104 (L) 03/06/2017     Time Spent in minutes  Greater than 50% of time spent in care coordination and counseling patient regarding the condition and plan of care.   Auburn Bilberry M.D on 03/06/2017 at 2:21 PM  Between 7am to 6pm - Pager - [phone]  After 6pm go to [email or web address] - password [email or web address] [facility]  [facility]   Office  [phone]

## 2017-03-06 NOTE — Progress Notes (Signed)
PT Cancellation Note  Patient Details Name: Bruce [name] MRN: [id number] DOB: 02-25-1932   Cancelled Treatment:    Reason Eval/Treat Not Completed: Patient not medically ready. Patient with R parietal and occipital hemorrhage, awaiting repeat CT scan. PT will defer evaluation at this time.  Alva GarnetPatrick Zamier Eggebrecht PT, DPT, CSCS    03/06/2017, 12:06 PM

## 2017-03-06 NOTE — Plan of Care (Signed)
  Education: Knowledge of General Education information will improve 03/06/2017 1159 - Progressing by Donnel SaxonKennedy, Christle Nolting L, RN   Health Behavior/Discharge Planning: Ability to manage health-related needs will improve 03/06/2017 1159 - Progressing by Donnel SaxonKennedy, Ching Rabideau L, RN   Clinical Measurements: Ability to maintain clinical measurements within normal limits will improve 03/06/2017 1159 - Progressing by Donnel SaxonKennedy, Deshan Hemmelgarn L, RN Will remain free from infection 03/06/2017 1159 - Progressing by Donnel SaxonKennedy, Esperansa Sarabia L, RN Diagnostic test results will improve 03/06/2017 1159 - Progressing by Donnel SaxonKennedy, Larence Thone L, RN Respiratory complications will improve 03/06/2017 1159 - Progressing by Donnel SaxonKennedy, Quintavia Rogstad L, RN Cardiovascular complication will be avoided 03/06/2017 1159 - Progressing by Donnel SaxonKennedy, Trinaty Bundrick L, RN   Activity: Risk for activity intolerance will decrease 03/06/2017 1159 - Progressing by Donnel SaxonKennedy, Jackelyne Sayer L, RN   Nutrition: Adequate nutrition will be maintained 03/06/2017 1159 - Progressing by Donnel SaxonKennedy, Lennart Gladish L, RN   Coping: Level of anxiety will decrease 03/06/2017 1159 - Progressing by Donnel SaxonKennedy, Anaijah Augsburger L, RN   Elimination: Will not experience complications related to bowel motility 03/06/2017 1159 - Progressing by Donnel SaxonKennedy, Ronnell Makarewicz L, RN Will not experience complications related to urinary retention 03/06/2017 1159 - Progressing by Donnel SaxonKennedy, Zephyr Sausedo L, RN   Pain Managment: General experience of comfort will improve 03/06/2017 1159 - Progressing by Donnel SaxonKennedy, Ritik Stavola L, RN   Safety: Ability to remain free from injury will improve 03/06/2017 1159 - Progressing by Donnel SaxonKennedy, Marlaysia Lenig L, RN   Skin Integrity: Risk for impaired skin integrity will decrease 03/06/2017 1159 - Progressing by Donnel SaxonKennedy, Brittany Osier L, RN

## 2017-03-07 DIAGNOSIS — S065X9A Traumatic subdural hemorrhage with loss of consciousness of unspecified duration, initial encounter: Secondary | ICD-10-CM | POA: Diagnosis not present

## 2017-03-07 LAB — GLUCOSE, CAPILLARY
GLUCOSE-CAPILLARY: 141 mg/dL — AB (ref 65–99)
GLUCOSE-CAPILLARY: 139 mg/dL — AB (ref 65–99)

## 2017-03-07 MED ORDER — HALOPERIDOL LACTATE 5 MG/ML IJ SOLN
2.0000 mg | Freq: Once | INTRAMUSCULAR | Status: AC
Start: 2017-03-07 — End: 2017-03-07
  Administered 2017-03-07: 2 mg via INTRAVENOUS
  Filled 2017-03-07: qty 1

## 2017-03-07 NOTE — Clinical Social Work Note (Signed)
Clinical Social Work Assessment  Patient Details  Name: Bruce [name] MRN: [id number] Date of Birth: 07/11/1932  Date of referral:  03/07/17               Reason for consult:  Discharge Planning                Permission sought to share information with:    Permission granted to share information::     Name::        Agency::     Relationship::     Contact Information:     Housing/Transportation Living arrangements for the past 2 months:  Single Family Home Source of Information:  Spouse Patient Interpreter Needed:  None Criminal Activity/Legal Involvement Pertinent to Current Situation/Hospitalization:  No - Comment as needed Significant Relationships:  Spouse Lives with:  Spouse Do you feel safe going back to the place where you live?  Yes Need for family participation in patient care:  Yes (Comment)  Care giving concerns:  Patient resides at home with his wife.   Social Worker assessment / plan:  CSW met with patient and spouse this morning to discuss PT recommendations. CSW introduced self and explained role and purpose of visit. Patient's wife states that she spoke with the physician and that they both feel as though patient will do better in his own home environment. She stated that she had read the medicare observation letter and was aware that currently he would not meet criteria to go into STR under medicare. Patient's wife stated that she would like for home health to be arranged and that he has had home health through [facility] in the past. CSW has relayed this information to weekend RN CM, Jeani Hawking.   Employment status:  Retired Forensic scientist:  Medicare PT Recommendations:  [place] / Referral to community resources:     Patient/Family's Response to care:  Patient's wife expressed appreciation for CSW visit.  Patient/Family's Understanding of and Emotional Response to Diagnosis, Current Treatment, and Prognosis:  Patient  remained confused but wife is aware and educated herself regarding treatment plan.  Emotional Assessment Appearance:  Appears stated age Attitude/Demeanor/Rapport:  (calm; not very talkative) Affect (typically observed):    Orientation:  Fluctuating Orientation (Suspected and/or reported Sundowners) Alcohol / Substance use:  Not Applicable Psych involvement (Current and /or in the community):  No (Comment)  Discharge Needs  Concerns to be addressed:  Care Coordination Readmission within the last 30 days:  No Current discharge risk:  None Barriers to Discharge:  No Barriers Identified   Shela Leff, LCSW 03/07/2017, 11:21 AM

## 2017-03-07 NOTE — Discharge Instructions (Signed)
[facility] - [place] at [facility]  DIET:  Dysphagia 3 diet  DISCHARGE CONDITION:  Stable  ACTIVITY:  Use walker to walk  OXYGEN:  Home Oxygen: No.   Oxygen Delivery: room air  DISCHARGE LOCATION:  home    ADDITIONAL DISCHARGE INSTRUCTION:   If you experience worsening of your admission symptoms, develop shortness of breath, life threatening emergency, suicidal or homicidal thoughts you must seek medical attention immediately by calling 911 or calling your MD immediately  if symptoms less severe.  You Must read complete instructions/literature along with all the possible adverse reactions/side effects for all the Medicines you take and that have been prescribed to you. Take any new Medicines after you have completely understood and accpet all the possible adverse reactions/side effects.   Please note  You were cared for by a hospitalist during your hospital stay. If you have any questions about your discharge medications or the care you received while you were in the hospital after you are discharged, you can call the unit and asked to speak with the hospitalist on call if the hospitalist that took care of you is not available. Once you are discharged, your primary care physician will handle any further medical issues. Please note that NO REFILLS for any discharge medications will be authorized once you are discharged, as it is imperative that you return to your primary care physician (or establish a relationship with a primary care physician if you do not have one) for your aftercare needs so that they can reassess your need for medications and monitor your lab values.

## 2017-03-07 NOTE — Discharge Summary (Signed)
[name], 84 y.o., DOB Aug 08, 1932, MRN [id number]. Admission date: 03/05/2017 Discharge Date 03/07/2017 Primary MD [name], MD Admitting Physician [name], MD  Admission Diagnosis  Subdural occipital hemorrhage [facility]) [I62.00]  Discharge Diagnosis   Active Problems:   Subdural hematoma (HCC)    Diabetes type 2 Essential hypertension  BPH Alzheimer's dementia       Hospital Course  y.o. male with a known history of essential hypertension, severe Alzheimer's dementia had a fall at home brought by the wife and CAT scan of the head showed small subdural hematoma on the right side, ER physician spoke to neurosurgery physician at [facility] he recommended hospitalist admission here but without any further neurosurgical intervention.  Patient was admitted for further evaluation and monitoring.  Repeat CT scan next day showed improvement and not worsening.  He was cleared by neurology to be discharged home.  However patient started getting confused and agitated related to his Alzheimer's dementia.  Requiring medications.  He was admitted under observation on presentation.  Patient initially was seen by PT recommended skilled nursing facility however patient would need 3 nights of stay in the hospital.  And he did not qualify to stay in 3 nights.  Therefore repeat PT evaluation was done patient was able to ambulate they recommended 24-hour monitoring.  Wife will be arranging for home services to assist with her.  We will also arrange PT and home health aide.             Consults  neurology  Significant Tests:  See full reports for all details    Ct Head Wo Contrast  Result Date: 03/06/2017 CLINICAL DATA:  Progressive confusion. Intracranial hemorrhage. Fall. Subsequent exam. EXAM: CT HEAD WITHOUT CONTRAST TECHNIQUE: Contiguous axial images were obtained from the base of the skull through the vertex without  intravenous contrast. COMPARISON:  CT head without contrast 03/05/2016. FINDINGS: Brain: Extra-axial hemorrhage posteriorly along the right parietal and occipital lobe is stable to slightly decreased. No new areas of hemorrhage are present. Advanced atrophy and diffuse white matter disease is stable. No acute cortical infarct or mass lesion is present. Brainstem and cerebellum are unremarkable. Vascular: Atherosclerotic calcifications are present within the cavernous internal carotid arteries bilaterally. There is no hyperdense vessel. Skull: Calvarium is intact. Acute or healing fracture is present. No significant extracranial soft tissue injury is evident. Sinuses/Orbits: The paranasal sinuses and mastoid air cells are clear. IMPRESSION: 1. Stable slight decrease in size of right parietal and occipital extra-axial hemorrhage without associated fracture. 2. No new areas of hemorrhage. 3. Advanced atrophy and diffuse white matter disease likely reflects the sequela of chronic microvascular ischemia. Electronically Signed   By: Marin Roberts M.D.   On: 03/06/2017 14:07   Ct Head Wo Contrast  Result Date: 03/05/2017 CLINICAL DATA:  84 year old male with fall and facial trauma EXAM: CT HEAD WITHOUT CONTRAST CT MAXILLOFACIAL WITHOUT CONTRAST TECHNIQUE: Multidetector CT imaging of the head and maxillofacial structures were performed using the standard protocol without intravenous contrast. Multiplanar CT image reconstructions of the maxillofacial structures were also generated. 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 There is a small amount of extra-axial hemorrhage along the right posterior parietal lobe adjacent to the midline, likely combination of small subdural  and  subarachnoid bleed and measures up to 8 mm (sagittal series 9, image 26). Small amount of subarachnoid hemorrhage is also noted in the right occipital lobe. There is no mass effect or midline shift. Vascular: No hyperdense vessel or unexpected calcification. Skull: Normal. Negative for fracture or focal lesion. Other: None CT MAXILLOFACIAL FINDINGS Osseous: There is no acute fracture or dislocation. Arthritic changes of the TMJ bilaterally. Orbits: The globes and retro-orbital fat are preserved Sinuses: Mild diffuse mucoperiosteal thickening of paranasal sinuses. Small bilateral maxillary sinus retention cysts or polyps. No air-fluid levels. Soft tissues: Left facial soft tissue contusion.  No large hematoma. IMPRESSION: 1. Right parietal and occipital extra-axial hemorrhage, likely combination of subdural and subarachnoid hemorrhage. No mass effect or midline shift. 2. No acute/traumatic facial bone fractures. These results were called by telephone at the time of interpretation on 03/05/2017 at 6:29 am to Dr. Bayard MalesANDOLPH BROWN , who verbally acknowledged these results. Electronically Signed   By: Elgie CollardArash  Radparvar M.D.   On: 03/05/2017 06:36   Dg Humerus Left  Result Date: 03/05/2017 CLINICAL DATA:  84 year old male with fall and left upper extremity pain. EXAM: LEFT HUMERUS - 2+ VIEW; LEFT HAND - COMPLETE 3+ VIEW COMPARISON:  None. FINDINGS: There is no acute fracture or dislocation. The bones are osteopenic. Areas of linear lucency the scapula, likely chronic changes and related to demineralization. There is soft tissue swelling of the dorsum of the hand. No radiopaque foreign object or soft tissue gas. IMPRESSION: 1. No acute fracture or dislocation. 2. Soft tissue swelling of the hand. Clinical correlation is recommended. Electronically Signed   By: Elgie CollardArash  Radparvar M.D.   On: 03/05/2017 06:13   Dg Hand Complete Left  Result Date: 03/05/2017 CLINICAL DATA:  84 year old male with fall and left upper extremity  pain. EXAM: LEFT HUMERUS - 2+ VIEW; LEFT HAND - COMPLETE 3+ VIEW COMPARISON:  None. FINDINGS: There is no acute fracture or dislocation. The bones are osteopenic. Areas of linear lucency the scapula, likely chronic changes and related to demineralization. There is soft tissue swelling of the dorsum of the hand. No radiopaque foreign object or soft tissue gas. IMPRESSION: 1. No acute fracture or dislocation. 2. Soft tissue swelling of the hand. Clinical correlation is recommended. Electronically Signed   By: Elgie CollardArash  Radparvar M.D.   On: 03/05/2017 06:13   Ct Maxillofacial Wo Contrast  Result Date: 03/05/2017 CLINICAL DATA:  84 year old male with fall and facial trauma EXAM: CT HEAD WITHOUT CONTRAST CT MAXILLOFACIAL WITHOUT CONTRAST TECHNIQUE: Multidetector CT imaging of the head and maxillofacial structures were performed using the standard protocol without intravenous contrast. Multiplanar CT image reconstructions of the maxillofacial structures were also generated. 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 There is a small amount of extra-axial hemorrhage along the right posterior parietal lobe adjacent to the midline, likely combination of small subdural and subarachnoid bleed and measures up to 8 mm (sagittal series 9, image 26). Small amount of subarachnoid hemorrhage is also noted in the right occipital lobe. There is no mass effect or midline shift. Vascular: No hyperdense vessel or unexpected calcification. Skull: Normal. Negative for fracture or focal lesion. Other: None CT MAXILLOFACIAL FINDINGS Osseous: There is no acute fracture or dislocation. Arthritic changes of the TMJ bilaterally. Orbits: The globes and retro-orbital fat are preserved  Sinuses: Mild diffuse  mucoperiosteal thickening of paranasal sinuses. Small bilateral maxillary sinus retention cysts or polyps. No air-fluid levels. Soft tissues: Left facial soft tissue contusion.  No large hematoma. IMPRESSION: 1. Right parietal and occipital extra-axial hemorrhage, likely combination of subdural and subarachnoid hemorrhage. No mass effect or midline shift. 2. No acute/traumatic facial bone fractures. These results were called by telephone at the time of interpretation on 03/05/2017 at 6:29 am to Dr. Bayard Males , who verbally acknowledged these results. Electronically Signed   By: Elgie Collard M.D.   On: 03/05/2017 06:36       Today   Subjective:   Bruce [name] patient pleasantly confused Objective:   Blood pressure 133/67, pulse 60, temperature 97.9 F (36.6 C), temperature source Axillary, resp. rate 16, height 5\' 7"  (1.702 m), weight 130 lb 3.2 oz (59.1 kg), SpO2 97 %.  .  Intake/Output Summary (Last 24 hours) at 03/07/2017 1440 Last data filed at 03/07/2017 1053 Gross per 24 hour  Intake 0 ml  Output 1200 ml  Net -1200 ml    Exam VITAL SIGNS: Blood pressure 133/67, pulse 60, temperature 97.9 F (36.6 C), temperature source Axillary, resp. rate 16, height 5\' 7"  (1.702 m), weight 130 lb 3.2 oz (59.1 kg), SpO2 97 %.  GENERAL:  84 y.o.-year-old patient lying in the bed with no acute distress.  EYES: Pupils equal, round, reactive to light and accommodation. No scleral icterus. Extraocular muscles intact.  HEENT: Head atraumatic, normocephalic. Oropharynx and nasopharynx clear.  NECK:  Supple, no jugular venous distention. No thyroid enlargement, no tenderness.  LUNGS: Normal breath sounds bilaterally, no wheezing, rales,rhonchi or crepitation. No use of accessory muscles of respiration.  CARDIOVASCULAR: S1, S2 normal. No murmurs, rubs, or gallops.  ABDOMEN: Soft, nontender, nondistended. Bowel sounds present. No organomegaly or mass.  EXTREMITIES: No pedal edema, cyanosis, or  clubbing.  NEUROLOGIC: Cranial nerves II through XII are intact. Muscle strength 5/5 in all extremities. Sensation intact. Gait not checked.  PSYCHIATRIC: Pleasantly confused SKIN: No obvious rash, lesion, or ulcer.   Data Review     CBC w Diff:  Lab Results  Component Value Date   WBC 5.4 03/06/2017   HGB 14.3 03/06/2017   HCT 42.7 03/06/2017   PLT 104 (L) 03/06/2017   CMP:  Lab Results  Component Value Date   NA 135 03/06/2017   K 4.2 03/06/2017   CL 107 03/06/2017   CO2 21 (L) 03/06/2017   BUN 21 (H) 03/06/2017   CREATININE 1.00 03/06/2017   PROT 6.7 03/05/2017   ALBUMIN 4.1 03/05/2017   BILITOT 0.6 03/05/2017   ALKPHOS 47 03/05/2017   AST 28 03/05/2017   ALT 20 03/05/2017  .  Micro Results No results found for this or any previous visit (from the past 240 hour(s)).      Code Status Orders  (From admission, onward)        Start     Ordered   03/05/17 0847  Do not attempt resuscitation (DNR)  Continuous    Question Answer Comment  In the event of cardiac or respiratory ARREST Do not call a "code blue"   In the event of cardiac or respiratory ARREST Do not perform Intubation, CPR, defibrillation or ACLS   In the event of cardiac or respiratory ARREST Use medication by any route, position, wound care, and other measures to relive pain and suffering. May use oxygen, suction and manual treatment of airway obstruction as needed for comfort.   Comments  NMP      03/05/17 0850    Code Status History    Date Active Date Inactive Code Status Order ID Comments User Context   This patient has a current code status but no historical code status.    Advance Directive Documentation     Most Recent Value  Type of Advance Directive  Healthcare Power of Attorney, Living will  Pre-existing out of facility DNR order (yellow form or pink MOST form)  No data  "MOST" Form in Place?  No data          Follow-up Information    [name], MD Follow up in 1  week(s).   Specialty:  Internal Medicine Why:  hosp f/u Contact information: [address] [facility] [place] [address] [phone]           Discharge Medications   Allergies as of 03/07/2017   No Known Allergies     Medication List    STOP taking these medications   aspirin EC 81 MG tablet   ibuprofen 200 MG tablet Commonly known as:  ADVIL,MOTRIN     TAKE these medications   amitriptyline 25 MG tablet Commonly known as:  ELAVIL Take 25 mg by mouth at bedtime.   diphenhydrAMINE 25 mg capsule Commonly known as:  BENADRYL Take 1 capsule by mouth every 6 (six) hours as needed.   docusate sodium 100 MG capsule Commonly known as:  COLACE Take 1 capsule by mouth 2 (two) times daily.   doxazosin 4 MG tablet Commonly known as:  CARDURA Take 1 tablet by mouth daily.   glimepiride 2 MG tablet Commonly known as:  AMARYL Take 1 tablet by mouth daily.   lisinopril 20 MG tablet Commonly known as:  PRINIVIL,ZESTRIL Take 1 tablet by mouth daily.   memantine 5 MG tablet Commonly known as:  NAMENDA Take 1 tablet by mouth 2 (two) times daily.   metFORMIN 500 MG tablet Commonly known as:  GLUCOPHAGE Take 1 tablet by mouth 2 (two) times daily.   MULTI-VITAMINS Tabs Take 1 tablet by mouth daily.   omeprazole 20 MG capsule Commonly known as:  PRILOSEC Take 1 capsule by mouth daily.   simvastatin 20 MG tablet Commonly known as:  ZOCOR Take 1 tablet by mouth daily.            Durable Medical Equipment  (From admission, onward)        Start     Ordered   03/07/17 1402  For home use only DME Walker rolling  Once    Question:  Patient needs a walker to treat with the following condition  Answer:  Unstable gait   03/07/17 1401         Total Time in preparing paper work, data evaluation and todays exam - 35 minutes  Auburn Bilberry M.D on 03/07/2017 at 2:40 PM  [facility]   Office  [phone]

## 2017-03-07 NOTE — Care Management Note (Signed)
Case Management Note  Patient Details  Name: Bruce [name] MRN: [id number] Date of Birth: 04-05-1932  Subjective/Objective:  [name] referral for HH= PT, Aide, and SW was called to Bruce [name] at [name] and was accepted by Bruce [name]. Updated Sarah that Bruce [name] was having difficulty with future care planning for Bruce [name] who has severe Alzheimers. Therefore Bruce [name] requested HH=SW to be [name] home resource and guide for Bruce [name] about ongoing care for Bruce [name].                   Action/Plan:   Expected Discharge Date:                  Expected Discharge Plan:  Home w Home Health Services  In-House Referral:     Discharge planning Services  CM Consult  Post Acute Care Choice:  Home Health Choice offered to:  Spouse  DME Arranged:  Dan HumphreysWalker DME Agency:  [facility]  HH Arranged:  PT, Nurse's Aide, Social Work [employer] [facility]  Status of Service:  Completed, signed off  If discussed at [employer] Length of [employer] Meetings, dates discussed:    Additional Comments:  Bruce Cregg A, RN 03/07/2017, 2:35 PM

## 2017-03-07 NOTE — Care Management Note (Addendum)
Case Management Note  Patient Details  Name: [name] MRN: [id number] Date of Birth: Sep 07, 1932  Subjective/Objective:      Provided Bruce [name] with a 4 page Healthcare Referral Guide and a list of local personal care providers and phone numbers. Bruce [name] had previously [facility], and she was reminded that a referral for HH=PT, Aide, SW  [facility]. Bruce [name] verbalized concern that she may not be able to get Bruce [name] from her car into the house and today's PT assessment confirms her concerns. EMS transport has been arranged for Bruce [name]. A [place]  [place] [facility].               Action/Plan:   Expected Discharge Date:                  Expected Discharge Plan:     In-House Referral:     Discharge planning Services     Post Acute Care Choice:    Choice offered to:     DME Arranged:    DME Agency:     HH Arranged:    HH Agency:     Status of Service:     If discussed at [employer] Length of Stay Meetings, dates discussed:    Additional Comments:  Bre Pecina A, RN 03/07/2017, 1:57 PM

## 2017-03-07 NOTE — Progress Notes (Signed)
Patient discharged with wife. Wife verbalized understanding of education. IV removed, cathter intact. Patient with no complaints.

## 2017-03-07 NOTE — Plan of Care (Signed)
  Education: Knowledge of General Education information will improve 03/07/2017 1050 - Progressing by Donnel SaxonKennedy, Mylisa Brunson L, RN   Health Behavior/Discharge Planning: Ability to manage health-related needs will improve 03/07/2017 1050 - Progressing by Donnel SaxonKennedy, Jayli Fogleman L, RN   Clinical Measurements: Ability to maintain clinical measurements within normal limits will improve 03/07/2017 1050 - Progressing by Donnel SaxonKennedy, Cullen Vanallen L, RN Will remain free from infection 03/07/2017 1050 - Progressing by Donnel SaxonKennedy, Samuele Storey L, RN Diagnostic test results will improve 03/07/2017 1050 - Progressing by Donnel SaxonKennedy, Aaralynn Shepheard L, RN Respiratory complications will improve 03/07/2017 1050 - Progressing by Donnel SaxonKennedy, Markella Dao L, RN Cardiovascular complication will be avoided 03/07/2017 1050 - Progressing by Donnel SaxonKennedy, Allyn Bertoni L, RN   Activity: Risk for activity intolerance will decrease 03/07/2017 1050 - Progressing by Donnel SaxonKennedy, Jermain Curt L, RN   Nutrition: Adequate nutrition will be maintained 03/07/2017 1050 - Progressing by Donnel SaxonKennedy, Ajai Harville L, RN   Coping: Level of anxiety will decrease 03/07/2017 1050 - Progressing by Donnel SaxonKennedy, Taeden Geller L, RN   Elimination: Will not experience complications related to bowel motility 03/07/2017 1050 - Progressing by Donnel SaxonKennedy, Pharaoh Pio L, RN Will not experience complications related to urinary retention 03/07/2017 1050 - Progressing by Donnel SaxonKennedy, [place] Behlke L, RN   Pain Managment: General experience of comfort will improve 03/07/2017 1050 - Progressing by Donnel SaxonKennedy, Jazae Gandolfi L, RN   Safety: Ability to remain free from injury will improve 03/07/2017 1050 - Progressing by Donnel SaxonKennedy, Amante Fomby L, RN   Skin Integrity: Risk for impaired skin integrity will decrease 03/07/2017 1050 - Progressing by Donnel SaxonKennedy, Keli Buehner L, RN

## 2017-03-07 NOTE — Progress Notes (Addendum)
Physical Therapy Treatment Patient Details Name: Bruce [name] MRN: [id number] DOB: August 25, 1932 Today's Date: 03/07/2017    History of Present Illness Patient is an 84 y/o male that presents after a fall, sustaining a closed head injury. CT showed R parietal and occipital extra-axial hemorrhage with combination of subdural and subarachnoid involvement.     PT Comments    Pt received in bed sitting up, calm conversational although minimally verbal. Wife/SIL in room. Pt following one step commands well when supported by intermittent tactile/visual cues. Pt noted to have significant Left grip strength deficits, and right lateral leaning during stance and AMB, requires minA 2x d/t LOB in AMB. PT recommending DC to home with 24/7 super vision and supervision for all AMB/mobility. Educated wife on close minGuard assist AMB until deficits resolve. Asked wife if she has given considerations to long term care options in the future as dementia progresses, but she expresses that she is not in an emotional state where she is able to meaningfully put thought into these questions. Pt making progress, but AMB instability remains >75% altered from baseline.      Follow Up Recommendations  Home health PT;Supervision for mobility/OOB;Supervision/Assistance - 24 hour     Equipment Recommendations  Rolling walker with 5" wheels    Recommendations for Other Services       Precautions / Restrictions Precautions Precautions: Fall Restrictions Weight Bearing Restrictions: No    Mobility  Bed Mobility Overal bed mobility: Needs Assistance Bed Mobility: Supine to Sit     Supine to sit: Supervision     General bed mobility comments: heavy tactile cues reqired.   Transfers Overall transfer level: Needs assistance Equipment used: 1 person hand held assist Transfers: Sit to/from Stand Sit to Stand: Min assist         General transfer comment: assymetrical leg weakness, requires stabilization.    Ambulation/Gait Ambulation/Gait assistance: Min assist;Min guard Ambulation Distance (Feet): 160 Feet Assistive device: 1 person hand held assist     Gait velocity interpretation: <1.8 ft/sec, indicative of risk for recurrent falls General Gait Details: flexed knee gait, some right lateral leaning, right buckling   Stairs            Wheelchair Mobility    Modified Rankin (Stroke Patients Only)       Balance Overall balance assessment: Needs assistance;History of Falls Sitting-balance support: Bilateral upper extremity supported                                        Cognition Arousal/Alertness: Awake/alert Behavior During Therapy: WFL for tasks assessed/performed Overall Cognitive Status: History of cognitive impairments - at baseline                                        Exercises      General Comments        Pertinent Vitals/Pain Pain Assessment: No/denies pain    Home Living                      Prior Function            PT Goals (current goals can now be found in the care plan section) Acute Rehab PT Goals Patient Stated Goal: To improve safety with mobility.  PT Goal Formulation: With family Time  For Goal Achievement: 03/20/17 Potential to Achieve Goals: Fair Progress towards PT goals: Progressing toward goals    Frequency    7X/week      PT Plan      Co-evaluation              AM-PAC PT "6 Clicks" Daily Activity  Outcome Measure  Difficulty turning over in bed (including adjusting bedclothes, sheets and blankets)?: A Little Difficulty moving from lying on back to sitting on the side of the bed? : A Lot Difficulty sitting down on and standing up from a chair with arms (e.g., wheelchair, bedside commode, etc,.)?: A Lot Help needed moving to and from a bed to chair (including a wheelchair)?: A Lot Help needed walking in hospital room?: A Lot Help needed climbing 3-5 steps with a  railing? : A Lot 6 Click Score: 13    End of Session Equipment Utilized During Treatment: Gait belt Activity Tolerance: Patient tolerated treatment well Patient left: in chair;with call bell/phone within reach;with chair alarm set;with family/visitor present Nurse Communication: Mobility status PT Visit Diagnosis: Unsteadiness on feet (R26.81);Muscle weakness (generalized) (M62.81);Difficulty in walking, not elsewhere classified (R26.2)     Time: [id number] PT Time Calculation (min) (ACUTE ONLY): 17 min  Charges:  $Therapeutic Activity: 8-22 mins                    G Codes:       1:19 PM, 03/07/17 Rosamaria LintsAllan C Doran Nestle, PT, DPT Physical Therapist -  [phone] (ASCOM)      Tasheena Wambolt C 03/07/2017, 1:15 PM

## 2017-03-16 ENCOUNTER — Other Ambulatory Visit: Payer: Self-pay | Admitting: Internal Medicine

## 2017-03-16 DIAGNOSIS — S065XAA Traumatic subdural hemorrhage with loss of consciousness status unknown, initial encounter: Secondary | ICD-10-CM

## 2017-03-16 DIAGNOSIS — S065X9A Traumatic subdural hemorrhage with loss of consciousness of unspecified duration, initial encounter: Secondary | ICD-10-CM

## 2017-03-30 ENCOUNTER — Ambulatory Visit
Admission: RE | Admit: 2017-03-30 | Discharge: 2017-03-30 | Disposition: A | Discharge status: Home / Self Care | Payer: Medicare Other | Source: Ambulatory Visit | Attending: Internal Medicine | Admitting: Internal Medicine

## 2017-03-30 DIAGNOSIS — S065X9A Traumatic subdural hemorrhage with loss of consciousness of unspecified duration, initial encounter: Secondary | ICD-10-CM | POA: Insufficient documentation

## 2017-03-30 DIAGNOSIS — X58XXXA Exposure to other specified factors, initial encounter: Secondary | ICD-10-CM | POA: Diagnosis not present

## 2017-03-30 DIAGNOSIS — S065XAA Traumatic subdural hemorrhage with loss of consciousness status unknown, initial encounter: Secondary | ICD-10-CM

## 2017-06-11 ENCOUNTER — Ambulatory Visit (INDEPENDENT_AMBULATORY_CARE_PROVIDER_SITE_OTHER): Payer: Medicare Other | Admitting: Vascular Surgery

## 2017-06-11 ENCOUNTER — Encounter (INDEPENDENT_AMBULATORY_CARE_PROVIDER_SITE_OTHER): Payer: Self-pay | Admitting: Vascular Surgery

## 2017-06-11 ENCOUNTER — Ambulatory Visit (INDEPENDENT_AMBULATORY_CARE_PROVIDER_SITE_OTHER): Payer: Medicare Other

## 2017-06-11 VITALS — BP 125/65 | HR 49 | Resp 13 | Ht 66.0 in | Wt 136.0 lb

## 2017-06-11 DIAGNOSIS — S065XAA Traumatic subdural hemorrhage with loss of consciousness status unknown, initial encounter: Secondary | ICD-10-CM

## 2017-06-11 DIAGNOSIS — I10 Essential (primary) hypertension: Secondary | ICD-10-CM

## 2017-06-11 DIAGNOSIS — S065X9A Traumatic subdural hemorrhage with loss of consciousness of unspecified duration, initial encounter: Secondary | ICD-10-CM

## 2017-06-11 DIAGNOSIS — E785 Hyperlipidemia, unspecified: Secondary | ICD-10-CM

## 2017-06-11 DIAGNOSIS — E118 Type 2 diabetes mellitus with unspecified complications: Secondary | ICD-10-CM | POA: Diagnosis not present

## 2017-06-11 DIAGNOSIS — I6523 Occlusion and stenosis of bilateral carotid arteries: Secondary | ICD-10-CM

## 2017-06-11 NOTE — Assessment & Plan Note (Signed)
After his fall in February.  Still with some residual effects from this.

## 2017-06-11 NOTE — Assessment & Plan Note (Signed)
Carotid duplex today demonstrates a widely patent right carotid endarterectomy and no hemodynamically significant stenosis in the left carotid artery.  No role for intervention.  At this point, his wife asks if we can extend his follow-ups we will see him in 2 years.

## 2017-06-11 NOTE — Progress Notes (Signed)
MRN : [id number]  Bruce [name] is a 84 y.o. (Mar 08, 1932) male who presents with chief complaint of  Chief Complaint  Patient presents with  . Follow-up    1 year Carotid follow up  .  History of Present Illness: Patient returns in follow-up.  He had a severe fall about 3 months ago which she is still really recovering from.  He has had no major focal neurologic symptoms but his dementia is progressing.  Carotid duplex today demonstrates a widely patent right carotid endarterectomy and no hemodynamically significant stenosis in the left carotid artery.  Current Outpatient Medications  Medication Sig Dispense Refill  . amitriptyline (ELAVIL) 25 MG tablet Take 25 mg by mouth at bedtime.    . diphenhydrAMINE (BENADRYL) 25 mg capsule Take 1 capsule by mouth every 6 (six) hours as needed.    . docusate sodium (COLACE) 100 MG capsule Take 1 capsule by mouth 2 (two) times daily.    Marland Kitchen doxazosin (CARDURA) 4 MG tablet Take 1 tablet by mouth daily.    Marland Kitchen glimepiride (AMARYL) 2 MG tablet Take 1 tablet by mouth daily.    Marland Kitchen lisinopril (PRINIVIL,ZESTRIL) 20 MG tablet Take 1 tablet by mouth daily.    . memantine (NAMENDA) 5 MG tablet Take 1 tablet by mouth 2 (two) times daily.    . metFORMIN (GLUCOPHAGE) 500 MG tablet Take 1 tablet by mouth 2 (two) times daily.    . Multiple Vitamin (MULTI-VITAMINS) TABS Take 1 tablet by mouth daily.    Marland Kitchen omeprazole (PRILOSEC) 20 MG capsule Take 1 capsule by mouth daily.    . simvastatin (ZOCOR) 20 MG tablet Take 1 tablet by mouth daily.    . tamsulosin (FLOMAX) 0.4 MG CAPS capsule Take by mouth.     No current facility-administered medications for this visit.     Past medical history Dementia Carotid artery stenosis BPH Diabetes Hypertension Hyperlipidemia  Past surgical history Right carotid endarterectomy about 15 years ago   Social History     Social History  Substance Use Topics  . Smoking status: Never Smoker  . Smokeless tobacco: Never  Used  . Alcohol use Yes    Family History No bleeding or clotting disorders  No Known Allergies   REVIEW OF SYSTEMS (Negative unless checked)  Constitutional: Weight loss  Fever  Chills Cardiac: Chest pain   Chest pressure   Palpitations   Shortness of breath when laying flat   Shortness of breath at rest   Shortness of breath with exertion. Vascular:  Pain in legs with walking   Pain in legs at rest   Pain in legs when laying flat   Claudication   Pain in feet when walking  Pain in feet at rest  Pain in feet when laying flat   History of DVT   Phlebitis   Swelling in legs   Varicose veins   Non-healing ulcers Pulmonary:   Uses home oxygen   Productive cough   Hemoptysis   Wheeze  COPD   Asthma Neurologic:  Dizziness  Blackouts   Seizures   History of stroke   History of TIA  Aphasia   Temporary blindness   Dysphagia   Weakness or numbness in arms   Weakness or numbness in legs  X positive for dementia Musculoskeletal:  Arthritis   Joint swelling   Joint pain   Low back pain Hematologic:  Easy bruising  Easy bleeding   Hypercoagulable state   Anemic  Hepatitis Gastrointestinal:    Blood in stool   Vomiting blood  Gastroesophageal reflux/heartburn   Difficulty swallowing. Genitourinary:  Chronic kidney disease   Difficult urination  Frequent urination  Burning with urination   Blood in urine Skin:  Rashes   Ulcers   Wounds Psychological:  History of anxiety    History of major depression.      Physical Examination  Vitals:   06/11/17 1114 06/11/17 1115  BP: 123/61 125/65  Pulse: (!) 51 (!) 49  Resp: 13   Weight: 136 lb (61.7 kg)   Height:  (1.676 m)    Body mass index is 21.95 kg/m. Gen:  WD/WN, NAD.  Appears younger than stated age Head: [place]/AT, No temporalis wasting. Ear/Nose/Throat: Hearing grossly intact, nares w/o erythema or drainage,  trachea midline Eyes: Conjunctiva clear. Sclera non-icteric Neck: Supple.  Soft right carotid bruit  Pulmonary:  Good air movement, equal and clear to auscultation bilaterally.  Cardiac: RRR, No JVD Vascular:  Vessel Right Left  Radial Palpable Palpable                                    Musculoskeletal: M/S 5/5 throughout.  No deformity or atrophy.  No edema. Neurologic: CN 2-12 intact. Sensation grossly intact in extremities.  Symmetrical.  Speech is fluent. Motor exam as listed above. Psychiatric: Judgment intact, Mood & affect appropriate for pt's clinical situation. Dermatologic: No rashes or ulcers noted.  No cellulitis or open wounds.      CBC Lab Results  Component Value Date   WBC 5.4 03/06/2017   HGB 14.3 03/06/2017   HCT 42.7 03/06/2017   MCV 91.4 03/06/2017   PLT 104 (L) 03/06/2017    BMET    Component Value Date/Time   NA 135 03/06/2017 0558   K 4.2 03/06/2017 0558   CL 107 03/06/2017 0558   CO2 21 (L) 03/06/2017 0558   GLUCOSE 167 (H) 03/06/2017 0558   BUN 21 (H) 03/06/2017 0558   CREATININE 1.00 03/06/2017 0558   CALCIUM 8.5 (L) 03/06/2017 0558   GFRNONAA >60 03/06/2017 0558   GFRAA >60 03/06/2017 0558   CrCl cannot be calculated (Patient's most recent lab result is older than the maximum 21 days allowed.).  COAG Lab Results  Component Value Date   INR 0.99 03/05/2017    Radiology No results found.    Assessment/Plan Essential hypertension, benign blood pressure control important in reducing the progression of atherosclerotic disease. On appropriate oral medications.   Hyperlipidemia lipid control important in reducing the progression of atherosclerotic disease. Continue statin therapy   Diabetes (HCC) blood glucose control important in reducing the progression of atherosclerotic disease. Also, involved in wound healing. On appropriate medications.   Subdural hematoma (HCC) After his fall in February.  Still with some  residual effects from this.  Carotid stenosis Carotid duplex today demonstrates a widely patent right carotid endarterectomy and no hemodynamically significant stenosis in the left carotid artery.  No role for intervention.  At this point, his wife asks if we can extend his follow-ups we will see him in 2 years.    Festus Barren, MD  06/11/2017 12:20 PM    This note was created with Dragon medical transcription system.  Any errors from dictation are purely unintentional

## 2017-12-16 ENCOUNTER — Other Ambulatory Visit: Payer: Self-pay | Admitting: Internal Medicine

## 2017-12-16 DIAGNOSIS — R270 Ataxia, unspecified: Secondary | ICD-10-CM

## 2017-12-29 ENCOUNTER — Ambulatory Visit
Admission: RE | Admit: 2017-12-29 | Discharge: 2017-12-29 | Disposition: A | Discharge status: Home / Self Care | Payer: Medicare Other | Source: Ambulatory Visit | Attending: Internal Medicine | Admitting: Internal Medicine

## 2017-12-29 DIAGNOSIS — R270 Ataxia, unspecified: Secondary | ICD-10-CM | POA: Diagnosis present

## 2018-09-12 ENCOUNTER — Telehealth: Payer: Self-pay | Admitting: Nurse Practitioner

## 2018-09-12 NOTE — Telephone Encounter (Signed)
Spoke with wife Alma Friendly, and after discussion regarding Palliative services she was in agreement with this.  I have scheduled a Telephone Palliative Consult for 09/20/18 @ 1 PM.

## 2018-09-20 ENCOUNTER — Other Ambulatory Visit: Payer: Self-pay

## 2018-09-20 ENCOUNTER — Other Ambulatory Visit: Payer: Medicare Other | Admitting: Nurse Practitioner

## 2018-09-20 ENCOUNTER — Encounter: Payer: Self-pay | Admitting: Nurse Practitioner

## 2018-09-20 DIAGNOSIS — Z515 Encounter for palliative care: Secondary | ICD-10-CM

## 2018-09-20 NOTE — Progress Notes (Signed)
[facility] Consult Note Telephone: [phone]  Fax: [phone]  PATIENT NAME: Bruce [name] DOB: [birth year] MRN: [id number]  PRIMARY CARE PROVIDER:   Idelle Crouch, MD  REFERRING PROVIDER:  Idelle Crouch, MD [facility] [place],  [place] [address]  RESPONSIBLE PARTY:   Bruce [name] [phone]  Due to the COVID-19 crisis, this visit was done via telemedicine from my office and it was initiated and consent by this patient and or family.  RECOMMENDATIONS and PLAN:  1. ACP: DNR; [place] completed, placed in Epic/Vynca, Will mail with blank MOST form and hard choice book for review to revisit at next scheduled palliative care visit.   2. Memory loss secondary to dementia. Continue with supportive measures as chronic disease remains Progressive.  3. Unsteady gait secondary to progression of Dementia. Continue fall precautions, high-risk.   4. Palliative care encounter Palliative medicine team will continue to support patient, patient's family, and medical team. Visit consisted of counseling and education dealing with the complex and emotionally intense issues of symptom management and palliative care in the setting of serious and potentially life-threatening illness  I spent 60 minutes providing this consultation,  from 1:00pm to 2:00pm. More than 50% of the time in this consultation was spent coordinating communication.   HISTORY OF PRESENT ILLNESS:  Bruce [name] is a 85 y.o. year old male with multiple medical problems including Alzheimer's dementia , Subdural hematoma, Hypertension, hyperlipidemia, carotid stenosis, diabetes. Last hospitalization one year ago 02/2017 for subdural occipital hemorrhage for which he did not receive surgical intervention, repeated scan soda Improvement and discharged home. He was seen by Dr. Manuella [name] 8 / 14 / 2020 neurology with late onset moderate Alzheimer's disease  progression with deferring physical therapy. Considered [facility] in area and recommended inquiring about waiting list. He had a slow Progressive cognition since 2010 having trouble recalling conversations and short-term things. Cognitive impairment came on gradually. During the visit he was able to recall conversations, coordinate his wardrobe tired but does have trouble doing that now. Unable to manage his finances. He is able to drive a car but has limited driving to short trips. No gait impairment. Difficulty following conversations, TV shows. Difficulty learning new information. Minimal assistance with ADLs. He does have episodes of urinary incontinence. He does not have good control of bowel. He has been increasingly having difficulty with drooling. He does drink alcohol in the evening. MRI brain 6 / 22 / 2015 show generalized atrophy with moderately Advanced chronic microvascular ischemic changes, remote right parietal cortical infarction. Had tried Aricept in the past but no improvement so discontinued. He currently is on Namenda. He has now been having more trouble with movement, coordination and needs a wheelchair. He will confabulate, resist bathing and requires total ADL assistance. His wife is his sole caregiver. I called Bruce [name] for scheduled telemedicine telephonic palliative care visit. Bruce [name] and I talked about purpose of palliative care visit and she was an agreement. We talked about past medical history and the study of chronic disease. We talked the last time Bruce [name] was independent. He underwent carotid surgery 15 years ago and things have not been the same since. Bruce. [name] endorses the memory, cognitive changes have accelerated now. We talked about how he functions, he brushes his teeth, he baths himself, he shaves himself. He has trouble keeping up the order in which to put his clothes on. He wears adult diapers  due to incontinence. Bruce. Beverely [name] endorses he makes a mess in the  bathroom, urinates on the floor. He also gets bm's on the seat, difficulty wiping himself. He is sleeping a lot. Bruce. Beverely [name] endorses he is very possessive of her, unable to relate to her condition. Bruce. Beverely [name] endorses she is doing the best that she can. Bruce. Beverely [name] endorses she has trouble going to the store. Bruce. Beverely [name] tries to go to the grocery store, will bring him when it is cool, leave him in the car but concerned now he will get out, coming into the store with behaviors. He is walking independently but stays in the chair most of the day. He walks drags one of his leg, he has begun to fall. He walks through the house grabbing things concerned he is going to fall. He fell 3 times in one day. He feeds himself but very messy. He does not appear to be losing weight. He eats fairly well. He drops most of his food. He drools, dropping out of his mouth onto the floor. He has a over production of phlegm, Bruce. Beverely [name] endorses sounds like a smokers cough. He does not exhibit pain or shortness of breath. Bruce. Beverely [name] endorses she herself has been in afib, she has been very tired, unable to do anything for her until the end of the month, have to shock her heart and she needed to start blood thinners. We talked about realistic expectations with disease progression of Alzheimer's dementia. We talked about further planning for possible placement in the future. Bruce [name] endorses that she wants to keep him at home as long as possible in order to try to care for him. We talked about memory care units. We talked about in home care assistance. We talked about palliative care social worker in Bruce [name] in agreement to have social worker contact her from palliative care for further information about In-home caregivers. We talked about medical goals that are including aggressive versus conservative vs comfort care. Bruce [name] endorses that they do have a living will, advance directives that say they do not want to be sustained on life  support. We talked about his wishes for a DNR but she does not have an out of facility for him at home. I asked if it was okay to complete a new form, place in [place] and choose an agreement. We talked about most form and Bruce [name] endorses she was open to being sent a blank MOST form to review in addition to Hard Choice book. Discussed will  follow up with MOST form next palliative care visit. We talked about role of palliative care and plan of care. We talked about Life review as they married but never had children. They rely on their church friends for extra assistance but Bruce. Beverely [name] endorses she does not feel comfortable asking people to help them. Bruce [name] express that she is very thankful for palliative care visit, support. Therapeutic listening and emotional support provided. Contact information given. Scheduled next palliative care visit in 4 weeks or sooner said she wants to complete the most form or he declines. Questions answered to satisfaction. Palliative Care was asked to help address goals of care.   CODE STATUS: DNR  PPS: 40% HOSPICE ELIGIBILITY/DIAGNOSIS: TBD  PAST MEDICAL HISTORY:  Past Medical History:  Diagnosis Date  . Hyperlipidemia   . Hypertension     SOCIAL HX:  Social History   Tobacco Use  . Smoking status: Never Smoker  .  Smokeless tobacco: Never Used  Substance Use Topics  . Alcohol use: Yes    ALLERGIES: No Known Allergies   PERTINENT MEDICATIONS:  Outpatient Encounter Medications as of 09/20/2018  Medication Sig  . amitriptyline (ELAVIL) 25 MG tablet Take 25 mg by mouth at bedtime.  . diphenhydrAMINE (BENADRYL) 25 mg capsule Take 1 capsule by mouth every 6 (six) hours as needed.  . docusate sodium (COLACE) 100 MG capsule Take 1 capsule by mouth 2 (two) times daily.  Marland Kitchen doxazosin (CARDURA) 4 MG tablet Take 1 tablet by mouth daily.  Marland Kitchen glimepiride (AMARYL) 2 MG tablet Take 1 tablet by mouth daily.  Marland Kitchen lisinopril (PRINIVIL,ZESTRIL) 20 MG tablet Take 1  tablet by mouth daily.  . memantine (NAMENDA) 5 MG tablet Take 1 tablet by mouth 2 (two) times daily.  . metFORMIN (GLUCOPHAGE) 500 MG tablet Take 1 tablet by mouth 2 (two) times daily.  . Multiple Vitamin (MULTI-VITAMINS) TABS Take 1 tablet by mouth daily.  Marland Kitchen omeprazole (PRILOSEC) 20 MG capsule Take 1 capsule by mouth daily.  . simvastatin (ZOCOR) 20 MG tablet Take 1 tablet by mouth daily.   No facility-administered encounter medications on file as of 09/20/2018.     PHYSICAL EXAM:   Deferred  Christin Z Gusler, NP

## 2018-09-27 ENCOUNTER — Telehealth: Payer: Self-pay

## 2018-09-27 NOTE — Telephone Encounter (Signed)
SW received referral from [place], NP to contact patient's wife, Bruce [name] regarding goals of care and in-home care options. SW contacted [place]. Bruce [name] provided brief history of patient and overview of patient's current needs. Patient has periods of confusion. Patient does have agitation but Bruce [name] is careful to navigate this with her own behavior and patient's reaction. Bruce [name] said she has local family including her younger sister and patient's younger brother that can help sit with patient. Patient and Bruce [name] go to [place] said that the church is very supportive. Discussed in-home care and options available. Bruce [name] expressed her concerns for patient's willingness to let someone else care for him. SW and [place] discussed caregiver role and importance of self-care. SW also encouraged Bruce [name] to consider slowly introduce in-home aides and allow patient to get used to a new presence in the home. Bruce [name] acknowledged that if patient continues decline then she will have to hire help because it would be too difficult to manage. Bruce [name] said that things are "okay" right now but it is becoming more difficult for her. SW provided emotional support, validated Lydia's concerns and encouraged continued conversations about care options. Bruce [name] appreciative of palliative care support and confirmed that she has contact information if needed. I spent 45 minutes with patient/family providing education, support and consultation.

## 2018-10-06 ENCOUNTER — Encounter: Payer: Self-pay | Admitting: Nurse Practitioner

## 2018-10-06 ENCOUNTER — Other Ambulatory Visit: Payer: Self-pay

## 2018-10-06 ENCOUNTER — Other Ambulatory Visit: Payer: Medicare Other | Admitting: Nurse Practitioner

## 2018-10-06 DIAGNOSIS — Z515 Encounter for palliative care: Secondary | ICD-10-CM

## 2018-10-06 NOTE — Progress Notes (Signed)
[place] Consult Note Telephone: [phone]  Fax: [phone]  PATIENT NAME: Bruce [name] DOB: May 05, 1932 MRN: [id number]  PRIMARY CARE PROVIDER:   Idelle Crouch, MD  REFERRING PROVIDER:  Idelle Crouch, MD [facility] [place],  [place] [address]  RESPONSIBLE PARTY:   Isaiah Cianci [phone]  Due to the COVID-19 crisis, this visit was done via telemedicine from my office and it was initiated and consent by this patient and or family.  RECOMMENDATIONS and PLAN:  1. ACP: DNR; Ms. Winchell wanted to read the hard choice book then revisit most form at next pc visit  2. Memory loss secondary to dementia. Continue with supportive measures as chronic disease remains Progressive.  3. Unsteady gait secondary to progression of Dementia. Continue fall precautions, high-risk.   4. Palliative care encounter Palliative medicine team will continue to support patient, patient's family, and medical team. Visit consisted of counseling and education dealing with the complex and emotionally intense issues of symptom management and palliative care in the setting of serious and potentially life-threatening illness  I spent 40 minutes providing this consultation,  from 12:00pm to 12:40pm. More than 50% of the time in this consultation was spent coordinating communication.   HISTORY OF PRESENT ILLNESS:  Bruce [name] is a 85 y.o. year old male with multiple medical problems including Alzheimer's dementia , Subdural hematoma, Hypertension, hyperlipidemia, carotid stenosis, diabetes. I called [name], [name] wife. We talked about purpose for palliative care follow-up telemedicine / telephonic visit. [name] in agreement. We talked about how [name] been doing. [name] endorses he is so slow functional change as now he is having more difficulty putting clothes on himself. He is requiring more assistance. We  talked about chronic disease progression. We talked about overall functional decline. He is still ambulatory so slower in his mobility. He continues to feed himself an appetite varies from day to day. We talked about cognitive changes. We talked about symptoms of pain but she does not seem to exhibit. We talked about behavior. [name] talked about her health challenges with atrial with her possibly having a procedure soon. [name] endorses she does bring him to her appointment and he is able to sit in a wheelchair without difficulties. We talked about her support system which is very limited. We talked about medical goals of care with limiting suffering. DNR is already in place. MOST form table to next palliative care schedule visit. We talked about role of palliative care in plan of care. Discuss that will follow up in 8 weeks if needed or sooner should he declined despite slow progressive changes he does continue to appear stable.. Ms Spano in agreement. Appointment scheduled. Therapeutic listening and emotional support provided. Contact information provided. Questions answered to satisfaction  Palliative Care was asked to help to continue to address goals of care.   CODE STATUS: DNR  PPS: 50% HOSPICE ELIGIBILITY/DIAGNOSIS: TBD  PAST MEDICAL HISTORY:  Past Medical History:  Diagnosis Date  . Alzheimer's dementia ([place])   . Hyperlipidemia   . Hypertension     SOCIAL HX:  Social History   Tobacco Use  . Smoking status: Never Smoker  . Smokeless tobacco: Never Used  Substance Use Topics  . Alcohol use: Yes    ALLERGIES: No Known Allergies   PERTINENT MEDICATIONS:  Outpatient Encounter Medications as of 10/06/2018  Medication Sig  . amitriptyline (ELAVIL) 25 MG tablet Take  25 mg by mouth at bedtime.  . diphenhydrAMINE (BENADRYL) 25 mg capsule Take 1 capsule by mouth every 6 (six) hours as needed.  . docusate sodium (COLACE) 100 MG capsule Take 1 capsule by mouth 2 (two) times  daily.  Marland Kitchen doxazosin (CARDURA) 4 MG tablet Take 1 tablet by mouth daily.  Marland Kitchen glimepiride (AMARYL) 2 MG tablet Take 1 tablet by mouth daily.  Marland Kitchen lisinopril (PRINIVIL,ZESTRIL) 20 MG tablet Take 1 tablet by mouth daily.  . memantine (NAMENDA) 5 MG tablet Take 1 tablet by mouth 2 (two) times daily.  . metFORMIN (GLUCOPHAGE) 500 MG tablet Take 1 tablet by mouth 2 (two) times daily.  . Multiple Vitamin (MULTI-VITAMINS) TABS Take 1 tablet by mouth daily.  Marland Kitchen omeprazole (PRILOSEC) 20 MG capsule Take 1 capsule by mouth daily.  . simvastatin (ZOCOR) 20 MG tablet Take 1 tablet by mouth daily.   No facility-administered encounter medications on file as of 10/06/2018.     PHYSICAL EXAM:   Deferred  Jonaven Hilgers Z Kana Reimann, NP

## 2018-11-15 ENCOUNTER — Other Ambulatory Visit: Payer: Self-pay

## 2018-11-15 ENCOUNTER — Other Ambulatory Visit: Payer: Medicare Other | Admitting: Nurse Practitioner

## 2018-11-15 ENCOUNTER — Encounter: Payer: Self-pay | Admitting: Nurse Practitioner

## 2018-11-15 DIAGNOSIS — Z515 Encounter for palliative care: Secondary | ICD-10-CM

## 2018-11-15 NOTE — Progress Notes (Signed)
Therapist, nutritional Palliative Care Consult Note Telephone: [phone]  Fax: [phone]  PATIENT NAME: Bruce [name] DOB: Mar 14, 1932 MRN: [id number]  PRIMARY CARE PROVIDER:   [name], MD  REFERRING PROVIDER:  [name], MD [address] [facility] [place],  [place] [address]   RESPONSIBLE PARTY:   Bruce [name] [phone]  Due to the COVID-19 crisis, this visit was done via telemedicine from my office and it was initiated and consent by this patient and or family.  RECOMMENDATIONS and PLAN: 1.ACP: DNR; Bruce. [name] wanted to read the hard choice book then revisit most form at next pc visit  2.Memory loss secondary to dementia. Continue with supportive measures as chronic disease remains Progressive.  3.Unsteady gait secondary toprogression ofDementia. Continue fall precautions, high-risk.  4.Palliative care encounter Palliative medicine team will continue to support patient, patient's family, and medical team. Visit consisted of counseling and education dealing with the complex and emotionally intense issues of symptom management and palliative care in the setting of serious and potentially life-threatening illness  I spent 45 minutes providing this consultation,  from 1:00pm to 1:45pm. More than 50% of the time in this consultation was spent coordinating communication.   HISTORY OF PRESENT ILLNESS:  Bruce [name] is a 85 y.o. year old male with multiple medical problems including Alzheimer's dementia , Subdural hematoma, Hypertension, hyperlipidemia, carotid stenosis, diabetes. I called Bruce [name] for schedule palliative care follow-up telemedicine telephonic visit. Bruce [name] an agreement as Bruce. [name] is cognitively impaired. We talked about how Bruce [name] has been doing. She shares that he has his good days and he has his days that are more difficult. We talked about his appetite which has been good. Bruce. [name]  endorses that she does feed him as if she was left for him to feed himself it gets all over the place and it would take him a long time. We talked about as functional level as he still is mobile. He does try to get himself dressed although put shirts on inside out and backwards. So she allows him to try. He does get frustrated on occasion when doing certain ADL tasks but is easily redirected. No problems with behavior. No recent Falls, wounds, hospitalizations, infections. Bruce [name] talked about the cognitive decline as at times he is able to have a conversation and other times he has more difficulty with memory and language. Bruce [name] herself has recently undergone a cardioversion for atrial fibrillation which was successful. Bruce. [name] brother came and sat with him during that time. Bruce [name] talked about challenges with supportive Network although they do have some friends that are helpful and family. We talked about medical goals of care. Wishes are to wait until next palliative care visit to further discuss most form. Talked about role of palliative care and plan of care. Discussed will follow up in two months if needed or sooner should he declined. Bruce [name] in agreement. Therapeutic listening and emotional support provided. Contact information. Questions answered to satisfaction Palliative Care was asked to help to continue to address goals of care.   CODE STATUS: DNR  PPS: 50% HOSPICE ELIGIBILITY/DIAGNOSIS: TBD  PAST MEDICAL HISTORY:  Past Medical History:  Diagnosis Date  . Alzheimer's dementia ([facility])   . Hyperlipidemia   . Hypertension     SOCIAL HX:  Social History   Tobacco Use  . Smoking status: Never Smoker  . Smokeless tobacco: Never Used  Substance Use Topics  .  Alcohol use: Yes    ALLERGIES: No Known Allergies   PERTINENT MEDICATIONS:  Outpatient Encounter Medications as of 11/15/2018  Medication Sig  . amitriptyline (ELAVIL) 25 MG tablet Take 25 mg by mouth at bedtime.   . diphenhydrAMINE (BENADRYL) 25 mg capsule Take 1 capsule by mouth every 6 (six) hours as needed.  . docusate sodium (COLACE) 100 MG capsule Take 1 capsule by mouth 2 (two) times daily.  Marland Kitchen doxazosin (CARDURA) 4 MG tablet Take 1 tablet by mouth daily.  Marland Kitchen glimepiride (AMARYL) 2 MG tablet Take 1 tablet by mouth daily.  Marland Kitchen lisinopril (PRINIVIL,ZESTRIL) 20 MG tablet Take 1 tablet by mouth daily.  . memantine (NAMENDA) 5 MG tablet Take 1 tablet by mouth 2 (two) times daily.  . metFORMIN (GLUCOPHAGE) 500 MG tablet Take 1 tablet by mouth 2 (two) times daily.  . Multiple Vitamin (MULTI-VITAMINS) TABS Take 1 tablet by mouth daily.  Marland Kitchen omeprazole (PRILOSEC) 20 MG capsule Take 1 capsule by mouth daily.  . simvastatin (ZOCOR) 20 MG tablet Take 1 tablet by mouth daily.   No facility-administered encounter medications on file as of 11/15/2018.     PHYSICAL EXAM:   Deferred Pao Haffey Z Jadon Ressler, NP

## 2019-01-02 ENCOUNTER — Encounter: Payer: Self-pay | Admitting: Nurse Practitioner

## 2019-01-02 ENCOUNTER — Other Ambulatory Visit: Payer: Self-pay

## 2019-01-02 ENCOUNTER — Other Ambulatory Visit: Payer: Medicare Other | Admitting: Nurse Practitioner

## 2019-01-02 DIAGNOSIS — Z515 Encounter for palliative care: Secondary | ICD-10-CM

## 2019-01-02 DIAGNOSIS — F0391 Unspecified dementia with behavioral disturbance: Secondary | ICD-10-CM

## 2019-01-02 NOTE — Progress Notes (Signed)
Therapist, nutritional Palliative Care Consult Note Telephone: [phone]  Fax: [phone]  PATIENT NAME: Bruce [name] DOB: 1932-10-15 MRN: [id number]  PRIMARY CARE PROVIDER:   [name], MD  REFERRING PROVIDER:  [name], MD [address] [facility] [place],  [place] [address]   RESPONSIBLE PARTY:Lydia Hopfensperger [phone]  Due to the COVID-19 crisis, this visit was done via telemedicine from my office and it was initiated and consent by this patient and or family.  RECOMMENDATIONS and PLAN: 1.ACP: DNR;Ms. Berent wanted to read the hard choice book then revisit most form at next pc visit  2.Memory loss secondary to dementia. Continue with supportive measures as chronic disease remains Progressive.  3.Palliative care encounter Palliative medicine team will continue to support patient, patient's family, and medical team. Visit consisted of counseling and education dealing with the complex and emotionally intense issues of symptom management and palliative care in the setting of serious and potentially life-threatening illness  I spent 45 minutes providing this consultation,  from 1:00pm to 1:45pm. More than 50% of the time in this consultation was spent coordinating communication.   HISTORY OF PRESENT ILLNESS:  Bruce [name] is a 85 y.o. year old male with multiple medical problems including Alzheimer's dementia , Subdural hematoma, Hypertension, hyperlipidemia, carotid stenosis, diabetes. I called Mrs Rathbun first scheduled telemedicine telephonic palliative care follow-up visit. Mrs Thivierge and agreement. We talked about how Mr Kovalcik has been doing recently. Mrs Kuenzel endorses that he's continuing to overall decline slowly. Is functional abilities have lesson to where she is having to help him more get to get dressed. Mrs Wesche is having to bathe Mr. Beverely Pace. Mr. Yono does wear adult depends and is incontinent. Mrs  Pitre endorses Mr. Jaros does feed himself though he makes more of a mess than he puts in his mouth. Mrs Klingler endorsed Is she starting to have to feed him. We talked about his appetite which remains fairly good. No weight loss.  No recent hospitalizations, Falls, wounds, infections. We talked about recent cardioversion Mrs Galentine had. Mr. Peerson did well with family. Mrs Manson Passey endorses she feels so much better and has more energy, caregiver role seems to be a little easier. We talked about overall decline in progression in the setting of dementia. We talked about medical goals to continue to focus on treat what is treatable. We talked about caregiver fatigue and coping strategies. We talked about crovid pandemic and holidays , family. We talked about role of palliative care and plan of care. Discuss no new changes at present time. Will follow up in six weeks if needed or sooner should he declined. Ms. Vollmer in agreement and appointment scheduled. Therapeutic listening and emotional support provided. Contact information provided. Questions answered to satisfaction. Palliative Care was asked to help to continue to address goals of care.   CODE STATUS: DNR  PPS: 50% HOSPICE ELIGIBILITY/DIAGNOSIS: TBD  PAST MEDICAL HISTORY:  Past Medical History:  Diagnosis Date  . Alzheimer's dementia ([facility])   . Hyperlipidemia   . Hypertension     SOCIAL HX:  Social History   Tobacco Use  . Smoking status: Never Smoker  . Smokeless tobacco: Never Used  Substance Use Topics  . Alcohol use: Yes    ALLERGIES: No Known Allergies   PERTINENT MEDICATIONS:  Outpatient Encounter Medications as of 01/02/2019  Medication Sig  . amitriptyline (ELAVIL) 25 MG tablet Take 25 mg by mouth at bedtime.  . diphenhydrAMINE (  BENADRYL) 25 mg capsule Take 1 capsule by mouth every 6 (six) hours as needed.  . docusate sodium (COLACE) 100 MG capsule Take 1 capsule by mouth 2 (two) times daily.  Marland Kitchen doxazosin (CARDURA) 4 MG  tablet Take 1 tablet by mouth daily.  Marland Kitchen glimepiride (AMARYL) 2 MG tablet Take 1 tablet by mouth daily.  Marland Kitchen lisinopril (PRINIVIL,ZESTRIL) 20 MG tablet Take 1 tablet by mouth daily.  . memantine (NAMENDA) 5 MG tablet Take 1 tablet by mouth 2 (two) times daily.  . metFORMIN (GLUCOPHAGE) 500 MG tablet Take 1 tablet by mouth 2 (two) times daily.  . Multiple Vitamin (MULTI-VITAMINS) TABS Take 1 tablet by mouth daily.  Marland Kitchen omeprazole (PRILOSEC) 20 MG capsule Take 1 capsule by mouth daily.  . simvastatin (ZOCOR) 20 MG tablet Take 1 tablet by mouth daily.   No facility-administered encounter medications on file as of 01/02/2019.    PHYSICAL EXAM:   Deferred  Christin Z Gusler, NP

## 2019-02-13 ENCOUNTER — Other Ambulatory Visit: Payer: Medicare Other | Admitting: Nurse Practitioner

## 2019-02-13 ENCOUNTER — Encounter: Payer: Self-pay | Admitting: Nurse Practitioner

## 2019-02-13 ENCOUNTER — Other Ambulatory Visit: Payer: Self-pay

## 2019-02-13 DIAGNOSIS — Z515 Encounter for palliative care: Secondary | ICD-10-CM

## 2019-02-13 DIAGNOSIS — F0391 Unspecified dementia with behavioral disturbance: Secondary | ICD-10-CM

## 2019-02-13 NOTE — Progress Notes (Signed)
Therapist, nutritional Palliative Care Consult Note Telephone: [phone]  Fax: [phone]  PATIENT NAME: Bruce [name] DOB: [birth year] MRN: [id number]  PRIMARY CARE PROVIDER:   [name], MD  REFERRING PROVIDER:  [name], MD [address] [facility] [place],  [place] [address]   RESPONSIBLE PARTY:Bruce [name] [phone]  Due to the COVID-19 crisis, this visit was done via telemedicine from my office and it was initiated and consent by this patient and or family.  RECOMMENDATIONS and PLAN: 1.ACP: DNR;Ms. [name] wanted to read the hard choice book then revisit most form at next pc visit  2.Memory loss worsening secondary to dementia. Continue with supportive measures as chronic disease remains Progressive.  3.Unsteady gait secondary toprogression ofDementia. Continue fall precautions, high-risk.  4.Palliative care encounter Palliative medicine team will continue to support patient, patient's family, and medical team. Visit consisted of counseling and education dealing with the complex and emotionally intense issues of symptom management and palliative care in the setting of serious and potentially life-threatening illness  I spent 45 minutes providing this consultation,  from 2:00pm to 2:45pm. More than 50% of the time in this consultation was spent coordinating communication.   HISTORY OF PRESENT ILLNESS:  Bruce [name] is a 86 y.o. year old male with multiple medical problems including Alzheimer's dementia , Subdural hematoma, Hypertension, hyperlipidemia, carotid stenosis, diabetes. I called Bruce. Beverely [name], Bruce [name]'s wife for schedule telemedicine, telephonic is video not available follow-up palliative care visit. Bruce [name] and I talked about purpose of palliative care visit. Bruce [name] was in agreement. We talked about how Bruce. [name] has been feeling. Bruce. [name] endorses he seems to becoming more confused.  Bruce [name] endorses she is having more of a problem with his memory and confusion, cognitive decline. Functionally he continues to be ambulatory. He has had no recent falls. Bruce [name] endorses she does have to bathe and dress him. Bruce. [name] endorses Bruce. [name] does feed himself so gets it everywhere. Bruce [name] does feed him to prevent a big mess. Bruce. [name] endorses Bruce. [name] appetite is good and no noted weight loss. Bruce [name] talked about her heart, upcoming Cardiology appointment for herself and that she has remained in a stable rhythm which helps her feel better and be able to take care of Bruce. [name]. We talked about chronic disease progression of dementia. We talked about medications. We talked about medications for dementia for which he have been prescribed by neurology. Bruce [name] endorses that she is not seeing much of a benefit from that medication. We talked about option of stopping if the risks outweigh the benefits. We talked about upcoming neurology appointment. Bruce. [name] endorses that she will further discuss with Neurology at that appointment. We talked about medical goals of care. We talked about caregiver fatigue. We talked about coping strategies. We talked about role of palliative care and plan of care. Therapeutic listening and emotional support provided. Questions answer to satisfaction. Contact information next palliative care visit in 6 weeks if needed or sooner should Bruce. [name] decline. Appointment scheduled with Bruce [name]. Palliative Care was asked to help to continue to address goals of care.   CODE STATUS:   PPS: 0% HOSPICE ELIGIBILITY/DIAGNOSIS: TBD  PAST MEDICAL HISTORY:  Past Medical History:  Diagnosis Date  . Alzheimer's dementia ([facility])   . Hyperlipidemia   . Hypertension     SOCIAL HX:  Social History   Tobacco Use  . Smoking status:  Never Smoker  . Smokeless tobacco: Never Used  Substance Use Topics  . Alcohol use: Yes    ALLERGIES: No Known  Allergies   PERTINENT MEDICATIONS:  Outpatient Encounter Medications as of 02/13/2019  Medication Sig  . amitriptyline (ELAVIL) 25 MG tablet Take 25 mg by mouth at bedtime.  . diphenhydrAMINE (BENADRYL) 25 mg capsule Take 1 capsule by mouth every 6 (six) hours as needed.  . docusate sodium (COLACE) 100 MG capsule Take 1 capsule by mouth 2 (two) times daily.  Marland Kitchen doxazosin (CARDURA) 4 MG tablet Take 1 tablet by mouth daily.  Marland Kitchen glimepiride (AMARYL) 2 MG tablet Take 1 tablet by mouth daily.  Marland Kitchen lisinopril (PRINIVIL,ZESTRIL) 20 MG tablet Take 1 tablet by mouth daily.  . memantine (NAMENDA) 5 MG tablet Take 1 tablet by mouth 2 (two) times daily.  . metFORMIN (GLUCOPHAGE) 500 MG tablet Take 1 tablet by mouth 2 (two) times daily.  . Multiple Vitamin (MULTI-VITAMINS) TABS Take 1 tablet by mouth daily.  Marland Kitchen omeprazole (PRILOSEC) 20 MG capsule Take 1 capsule by mouth daily.  . simvastatin (ZOCOR) 20 MG tablet Take 1 tablet by mouth daily.   No facility-administered encounter medications on file as of 02/13/2019.    PHYSICAL EXAM:   Deferred  Nivin Braniff Z Nikkie Liming, NP

## 2019-03-20 ENCOUNTER — Encounter: Payer: Self-pay | Admitting: Nurse Practitioner

## 2019-03-20 ENCOUNTER — Other Ambulatory Visit: Payer: Medicare Other | Admitting: Nurse Practitioner

## 2019-03-20 ENCOUNTER — Other Ambulatory Visit: Payer: Self-pay

## 2019-03-20 DIAGNOSIS — F0391 Unspecified dementia with behavioral disturbance: Secondary | ICD-10-CM

## 2019-03-20 DIAGNOSIS — Z515 Encounter for palliative care: Secondary | ICD-10-CM

## 2019-03-20 NOTE — Progress Notes (Signed)
Therapist, nutritional Palliative Care Consult Note Telephone: [phone]  Fax: [phone]  PATIENT NAME: JOHNROBERT FOTI DOB: 08-01-1932 MRN: [id number] PRIMARY CARE PROVIDER:Sparks, Duane Lope, MD  REFERRING PROVIDER:Sparks, Duane Lope, MD [address] [facility] [place], [place] [address]  RESPONSIBLE PARTY:Lydia Rarick [id number]  Due to the COVID-19 crisis, this visit was done via telemedicine from my office and it was initiated and consent by this patient and or family.  RECOMMENDATIONS and PLAN: 1.ACP: DNR;Ms. Stanislawski wanted to read the hard choice book then revisit most form at next pc visit  2.Memory loss worsening secondary to dementia. Continue with supportive measures as chronic disease remains Progressive.  3.Unsteady gait secondary toprogression ofDementia. Continue fall precautions, high-risk.  4.Palliative care encounter Palliative medicine team will continue to support patient, patient's family, and medical team. Visit consisted of counseling and education dealing with the complex and emotionally intense issues of symptom management and palliative care in the setting of serious and potentially life-threatening illness  I spent 45 minutes providing this consultation,  from 2:00pm to 2:45pm. More than 50% of the time in this consultation was spent coordinating communication.   HISTORY OF PRESENT ILLNESS:  BENINO KORINEK is a 86 y.o. year old male with multiple medical problems including Alzheimer's dementia , Subdural hematoma, Hypertension, hyperlipidemia, carotid stenosis, diabetes.I called Mrs. Beverely Pace, Mr Lame's wife for scheduled telemedicine telephonic is video not available follow-up palliative care visit. Mrs. Cretella and I talked about purpose of palliative care visit. Mrs. Mahany in agreement. We talked about how Mr Faciane has been doing. This is Arlys John and daughter says she seemed so functional changes.  Mr. Caradine is having more trouble in the shower requiring more prompting. Yesterday Mr. Methot got in the shower, she put soap on the washcloth but he did not use it. He did come out of the shower wet but didn't clean himself. Mrs. Pitkin endorses she is having to dress him. Mrs. Judie Grieve endorses she sees overall declining changes in the setting of dementia. Mrs. Kurtzman endorses it takes a long time to get him dressed. Mr. Treece endorses he does feed himself though it does take some time. Mr. Tuccillo has been having more trouble with sandwiches keeping the bread together. Mrs. Fitzsimmons endorses Mr. Pietila is not losing weight. He does make a mess when he eats. Mr. Wieck does not seem to be having difficulty with chewing or swallowing at this time. Mrs. Senger endorses that at times he does continue to get irritable. Mrs. Rosas endorses Mr. Los does get up in the middle of the night to go to the bathroom and she will find him on the other end of the house but when she redirects back to bed. Mr. Sivertsen will usually go to sleep. We talked about progression of dementia. No recent falls, wounds, hospitalizations, infections. Mrs. Petruzzi endorses they both have received the series of covid-19. We talked about caregiver fatigue and coping strategies. Mrs. Bergeson endorses they been married over 60 years. We talked about further long-term planning should something happen to Mrs Steil or Mr Bergquist becomes worse where Mrs. Dubray is unable to care for him at home. Mrs. Cranshaw endorses she does know that she would have to place him in a facility. We talked about medical goals of care. Mrs. Buhl endorses that Mr. Prigmore stays in the house. Mr. Jaquay will turn on the TV although at times he does not always watch it. Mrs. Sonn endorses it  is very hard to see someone's decline with dementia. We talked about support. Mrs. Desroches endorses Mr. Boffa brother does come and help when she asks. We talked about realistic  expectations with progression. We talked about role of palliative care and plan of care. Will continue to follow monitor weight, appetite and progression of dementia. Discussed with Pennelope Bracken when he should start declining were functionally, weight loss of 10% we can look at adding possibly Medicare program which is the hospice benefit when he reaches possible life expectancy of six months or less. We talked about follow-up palliative care visit in 6 weeks if needed or sooner should Mr Deleonardis decline. Mrs. Fiorito in agreement. Appointment scheduled. Therapeutic listening, emotional support provided. Contact information provided. Questions answered to satisfaction  Palliative Care was asked to help to continue to address goals of care.   CODE STATUS: DNR  PPS: 40% HOSPICE ELIGIBILITY/DIAGNOSIS: TBD  PAST MEDICAL HISTORY:  Past Medical History:  Diagnosis Date  . Alzheimer's dementia ([place])   . Hyperlipidemia   . Hypertension     SOCIAL HX:  Social History   Tobacco Use  . Smoking status: Never Smoker  . Smokeless tobacco: Never Used  Substance Use Topics  . Alcohol use: Yes    ALLERGIES: No Known Allergies   PERTINENT MEDICATIONS:  Outpatient Encounter Medications as of 03/20/2019  Medication Sig  . amitriptyline (ELAVIL) 25 MG tablet Take 25 mg by mouth at bedtime.  . diphenhydrAMINE (BENADRYL) 25 mg capsule Take 1 capsule by mouth every 6 (six) hours as needed.  . docusate sodium (COLACE) 100 MG capsule Take 1 capsule by mouth 2 (two) times daily.  Marland Kitchen doxazosin (CARDURA) 4 MG tablet Take 1 tablet by mouth daily.  Marland Kitchen glimepiride (AMARYL) 2 MG tablet Take 1 tablet by mouth daily.  Marland Kitchen lisinopril (PRINIVIL,ZESTRIL) 20 MG tablet Take 1 tablet by mouth daily.  . memantine (NAMENDA) 5 MG tablet Take 1 tablet by mouth 2 (two) times daily.  . metFORMIN (GLUCOPHAGE) 500 MG tablet Take 1 tablet by mouth 2 (two) times daily.  . Multiple Vitamin (MULTI-VITAMINS) TABS Take 1 tablet by mouth  daily.  Marland Kitchen omeprazole (PRILOSEC) 20 MG capsule Take 1 capsule by mouth daily.  . simvastatin (ZOCOR) 20 MG tablet Take 1 tablet by mouth daily.   No facility-administered encounter medications on file as of 03/20/2019.    PHYSICAL EXAM:   deferred  Sonam Wandel Ihor Gully, NP

## 2019-04-24 ENCOUNTER — Other Ambulatory Visit: Payer: Medicare Other | Admitting: Nurse Practitioner

## 2019-04-24 ENCOUNTER — Encounter: Payer: Self-pay | Admitting: Nurse Practitioner

## 2019-04-24 ENCOUNTER — Other Ambulatory Visit: Payer: Self-pay

## 2019-04-24 DIAGNOSIS — F0391 Unspecified dementia with behavioral disturbance: Secondary | ICD-10-CM

## 2019-04-24 DIAGNOSIS — Z515 Encounter for palliative care: Secondary | ICD-10-CM

## 2019-04-24 NOTE — Progress Notes (Addendum)
[facility] Consult Note Telephone: [phone]  Fax: [phone]  PATIENT NAME: Bruce [name] DOB: 1932-04-02 MRN: [id number]  PRIMARY CARE PROVIDER:   Idelle Crouch, MD  REFERRING PROVIDER:  Idelle Crouch, MD [address] [facility] [place],  [place] [address]  RESPONSIBLE PARTY:[name] [phone]  Due to the COVID-19 crisis, this visit was done via telemedicine from my office and it was initiated and consent by this patient and or family.  RECOMMENDATIONS and PLAN: 1.ACP: DNR;[name] wanted to read the hard choice book then revisit most form at next pc visit  2.Memory lossworseningsecondary to dementia. Continue with supportive measures as chronic disease remains Progressive.  3.Unsteady gait secondary toprogression ofDementia. Continue fall precautions, high-risk.  4.Palliative care encounter Palliative medicine team will continue to support patient, patient's family, and medical team. Visit consisted of counseling and education dealing with the complex and emotionally intense issues of symptom management and palliative care in the setting of serious and potentially life-threatening illness  I spent 50 minutes providing this consultation,  from 1:00pm to 1:50pm. More than 50% of the time in this consultation was spent coordinating communication.   HISTORY OF PRESENT ILLNESS:  Bruce [name] is a 86 y.o. year old male with multiple medical problems including Alzheimer's dementia , Subdural hematoma, Hypertension, hyperlipidemia, carotid stenosis, diabetes.I called [name] for schedule palliative care telemedicine telephonic as video not available. [name] in agreement. We talked about how Bruce [name] has been doing. [name] in endorses that he is slowly progressing in dementia, declining. [name] endorses that he is becoming more and more confused each day. [name]   endorses she is having to give him more prompts in the shower, to wash his hair, simple commands. She does help him get dressed. Bruce [name] has been having more problems getting dressed as she's having to dress him. Bruce [name] continues to have episodes of incontinence. Bruce [name] does feed himself and appetite continues to be fairly good. [name] endorses no noted weight loss. Bruce [name] has been sleeping more. [name] endorses that there has been some time Bruce [name] becomes more frustrated and irritable. [name] endorses that she just walks away from him and let him settle down and then returns he's redirected and better. We talked about chronic disease progression with dementia. We talked about visit at neurology office with Ova Freshwater PA. We talked about medications including the Namenda. [name] endorses that she is afraid to stop Namenda without a tapir. We talked about option of paper giving him half a pill for what she has been doing for a few more days and then taper it off. [name] endorses she is concerned that if she stops to [place], something will get worse a lot faster. We talked about purpose of Namenda and current use. We talked about dementia and Bruce [name] current stage. We talked about pill burden, age related and comorbidities. We talked about caregiver fatigue. We talked about coping strategies. [name] endorses she is starting to think about possible placement. [name] endorses it seems to be getting harder and harder for her to care for him and her as well. [name] endorses she is concerned it is taking a toll on her as things seem to be more difficult each day. We talked about quality of life. We talked about coping strategies taking one day at a time. We talked about medical goals  of care. We talked about role of palliative care and plan of care. We talked about follow-up visit in 2 weeks to see how Bruce [name] is doing off the Namenda. [name] in  agreement. Discuss will continue to monitor and follow progression of dementia. Appointment scheduled. Contact information provided. Questions answered to satisfaction. Palliative Care was asked to help to continue to address goals of care.   CODE STATUS: DNR  PPS: 40% HOSPICE ELIGIBILITY/DIAGNOSIS: TBD  PAST MEDICAL HISTORY:  Past Medical History:  Diagnosis Date  . Alzheimer's dementia ([facility])   . Hyperlipidemia   . Hypertension     SOCIAL HX:  Social History   Tobacco Use  . Smoking status: Never Smoker  . Smokeless tobacco: Never Used  Substance Use Topics  . Alcohol use: Yes    ALLERGIES: No Known Allergies   PERTINENT MEDICATIONS:  Outpatient Encounter Medications as of 04/24/2019  Medication Sig  . amitriptyline (ELAVIL) 25 MG tablet Take 25 mg by mouth at bedtime.  . diphenhydrAMINE (BENADRYL) 25 mg capsule Take 1 capsule by mouth every 6 (six) hours as needed.  . docusate sodium (COLACE) 100 MG capsule Take 1 capsule by mouth 2 (two) times daily.  Marland Kitchen doxazosin (CARDURA) 4 MG tablet Take 1 tablet by mouth daily.  Marland Kitchen glimepiride (AMARYL) 2 MG tablet Take 1 tablet by mouth daily.  Marland Kitchen lisinopril (PRINIVIL,ZESTRIL) 20 MG tablet Take 1 tablet by mouth daily.  . memantine (NAMENDA) 5 MG tablet Take 1 tablet by mouth 2 (two) times daily.  . metFORMIN (GLUCOPHAGE) 500 MG tablet Take 1 tablet by mouth 2 (two) times daily.  . Multiple Vitamin (MULTI-VITAMINS) TABS Take 1 tablet by mouth daily.  Marland Kitchen omeprazole (PRILOSEC) 20 MG capsule Take 1 capsule by mouth daily.  . simvastatin (ZOCOR) 20 MG tablet Take 1 tablet by mouth daily.   No facility-administered encounter medications on file as of 04/24/2019.    PHYSICAL EXAM:   deferred  Kiannah Grunow Prince [place], NP

## 2019-05-23 ENCOUNTER — Encounter: Payer: Self-pay | Admitting: Nurse Practitioner

## 2019-05-23 ENCOUNTER — Other Ambulatory Visit: Payer: Self-pay

## 2019-05-23 ENCOUNTER — Other Ambulatory Visit: Payer: Medicare Other | Admitting: Nurse Practitioner

## 2019-05-23 DIAGNOSIS — F0391 Unspecified dementia with behavioral disturbance: Secondary | ICD-10-CM

## 2019-05-23 DIAGNOSIS — Z515 Encounter for palliative care: Secondary | ICD-10-CM

## 2019-05-23 NOTE — Progress Notes (Signed)
Therapist, nutritional Palliative Care Consult Note Telephone: [phone]  Fax: [phone]  PATIENT NAME: Bruce [name] DOB: 1932/05/05 MRN: [id number]  PRIMARY CARE PROVIDER:   [name], MD  REFERRING PROVIDER:  [name], MD [address] [facility] [place],  [place] [address]  RESPONSIBLE PARTY:Lydia Sadlon [id number]  Due to the COVID-19 crisis, this visit was done via telemedicine from my office and it was initiated and consent by this patient and or family.  RECOMMENDATIONS and PLAN: 1.ACP: DNR;Ms. Beavers wanted to read the hard choice book then revisit most form at next pc visit  2.Memory lossworseningsecondary to dementia. Continue with supportive measures as chronic disease remains Progressive.  3.Unsteady gait secondary toprogression ofDementia. Continue fall precautions, high-risk.  4.Palliative care encounter Palliative medicine team will continue to support patient, patient's family, and medical team. Visit consisted of counseling and education dealing with the complex and emotionally intense issues of symptom management and palliative care in the setting of serious and potentially life-threatening illness  I spent 50 minutes providing this consultation,  from 1:00pm to 1:50pm. More than 50% of the time in this consultation was spent coordinating communication.   HISTORY OF PRESENT ILLNESS:  Bruce [name] is a 86 y.o. year old male with multiple medical problems including Alzheimer's dementia , Subdural hematoma, Hypertension, hyperlipidemia, carotid stenosis, diabetes.I called [name] for telemedicine, telephonic is video not available follow-up palliative care visit. We talked about how [name] husband doing. Mrs. Poorman endorses he is beginning to have more trouble walking. [name] endorses that his legs seem to be like jello. No recent falls. We talked about [name] requiring more  assistance with ADLs. Mrs. Frady endorses now he is not able to take a shower on his own he requires to be bathed and dressed. We talked about [name] does remaining incontinence. We talked about Mr. Streight feeding himself the Mr. Bones endorses that he gets food everywhere. [name] endorses Mr. Lovvorn does seem to have a fairly good appetite. [name] endorses she has not had to buy new clothes as of yet he doesn't appear to have lost weight. We talked about the functional changes and overall decline. We talked about chronic disease progression. We talked about medical goals of care. We talked about symptoms for which he does not seem to exhibit. We talked about role of palliative care and plan of care. Asked [name] if it was okay to make an in-person visit 4 weeks or sooner said he declined. [name] in agreement. Appointment schedule. Therapeutic listening and emotional support provided. Contact information. Questions answered to satisfaction. Palliative Care was asked to help to continue to address goals of care.   CODE STATUS: DNR  PPS: 40% HOSPICE ELIGIBILITY/DIAGNOSIS: TBD  PAST MEDICAL HISTORY:  Past Medical History:  Diagnosis Date  . Alzheimer's dementia ([facility])   . Hyperlipidemia   . Hypertension     SOCIAL HX:  Social History   Tobacco Use  . Smoking status: Never Smoker  . Smokeless tobacco: Never Used  Substance Use Topics  . Alcohol use: Yes    ALLERGIES: No Known Allergies   PERTINENT MEDICATIONS:  Outpatient Encounter Medications as of 05/23/2019  Medication Sig  . amitriptyline (ELAVIL) 25 MG tablet Take 25 mg by mouth at bedtime.  . diphenhydrAMINE (BENADRYL) 25 mg capsule Take 1 capsule by mouth every 6 (six) hours as needed.  . docusate sodium (COLACE) 100 MG capsule  Take 1 capsule by mouth 2 (two) times daily.  Marland Kitchen doxazosin (CARDURA) 4 MG tablet Take 1 tablet by mouth daily.  Marland Kitchen glimepiride (AMARYL) 2 MG tablet Take 1 tablet by mouth daily.  Marland Kitchen  lisinopril (PRINIVIL,ZESTRIL) 20 MG tablet Take 1 tablet by mouth daily.  . memantine (NAMENDA) 5 MG tablet Take 1 tablet by mouth 2 (two) times daily.  . metFORMIN (GLUCOPHAGE) 500 MG tablet Take 1 tablet by mouth 2 (two) times daily.  . Multiple Vitamin (MULTI-VITAMINS) TABS Take 1 tablet by mouth daily.  Marland Kitchen omeprazole (PRILOSEC) 20 MG capsule Take 1 capsule by mouth daily.  . simvastatin (ZOCOR) 20 MG tablet Take 1 tablet by mouth daily.   No facility-administered encounter medications on file as of 05/23/2019.    PHYSICAL EXAM:   Deferred [name], NP

## 2019-06-06 ENCOUNTER — Encounter: Payer: Self-pay | Admitting: Nurse Practitioner

## 2019-06-06 ENCOUNTER — Other Ambulatory Visit: Payer: Self-pay

## 2019-06-06 ENCOUNTER — Other Ambulatory Visit: Payer: Medicare Other | Admitting: Nurse Practitioner

## 2019-06-06 DIAGNOSIS — F0391 Unspecified dementia with behavioral disturbance: Secondary | ICD-10-CM

## 2019-06-06 DIAGNOSIS — Z515 Encounter for palliative care: Secondary | ICD-10-CM

## 2019-06-06 NOTE — Progress Notes (Addendum)
Therapist, nutritional Palliative Care Consult Note Telephone: [phone]  Fax: [phone]  PATIENT NAME: Bruce [name] DOB: 12-15-1932 MRN: [id number]  PRIMARY CARE PROVIDER:   [name], MD  REFERRING PROVIDER:  [name], MD [address] [facility] [place],  [place] [address]  RESPONSIBLE PARTY:Lydia Severt [id number]  RECOMMENDATIONS and PLAN: 1.ACP: DNR;Bruce. Fouts wanted to read the hard choice book then revisit most form at next pc visit  2.Memory lossworseningsecondary to dementia. Continue with supportive measures as chronic disease remains Progressive.  Left arm 9 inches Left thigh 15 inches  3.Unsteady gait secondary toprogression ofDementia. Continue fall precautions, high-risk.  4.Palliative care encounter Palliative medicine team will continue to support patient, patient's family, and medical team. Visit consisted of counseling and education dealing with the complex and emotionally intense issues of symptom management and palliative care in the setting of serious and potentially life-threatening illness  I spent 90 minutes providing this consultation,  from 1:30pm to 3:00pm. More than 50% of the time in this consultation was spent coordinating communication.   HISTORY OF PRESENT ILLNESS:  Bruce [name] is a 86 y.o. year old male with multiple medical problems including Alzheimer's dementia , Subdural hematoma, Hypertension, hyperlipidemia, carotid stenosis, diabetes.In-person schedule palliative care follow-up visit with Bruce [name]. Bruce [name] is Health Care power of attorney. We talked about purpose of palliative care visit. Bruce [name] in agreement. We talked about how Bruce [name] has been doing. Bruce [name] endorses that he continues to decline overall. Cognitively his speeches were sending a getting more difficult to understand his language. Bruce [name] endorses Bruce [name] does mumble a lot and it is  hard to understand him. Bruce [name] has been coming more agitated in the evening time. More difficulty with redirection at times. Bruce [name] endorses Bruce [name] has become more obsessed with her. It is more difficult for her to go out to run errands with someone to sit with him. Functionally Bruce. Hageman is still ambulatory but he has had a fall a few days ago with no noted injury. Bruce [name] endorses Bruce [name] gate is more shuffle, slow. Bruce [name] endorses that he is now total ADL dependents including bathing, dressing. He does continue to remain incontinent wearing an adult diaper. Bruce Purdy endorses Bruce [name] does feed himself at times she will feed him takes about 15 minutes. Appetite continues to be fair to good. Bruce [name] endorses no noted weight loss. We talked about swallowing. Bruce Mcneish endorses that Bruce [name] does continue to drool and has now had two episodes of vomiting green liquid that is slimy. Bruce [name] endorses Bruce [name] has tried two different kinds of medicine that has not seemed to improve symptoms. We talked about chronic disease progression of dementia. We talked about medical goals of care which are to focus on [place]. DNR in place. We talked about placement at the facility. We talked about upcoming appointment with Bruce [name] June 17th. We talked about FL2 form. Information given concerning PACE program, facilities. Bruce [name] and I talked about process of placement. We talked about contacting multiple locked memory care unit facilities to talk about the process, cost and often to visit. Bruce [name] talked at length about difficulty attempting to care for him at home. Bruce [name] endorses that she feels like she is giving up too soon. We talked about caregiver fatigue as her health is suffering with more Health complications in trying to care for Bruce.  Marshell [name] at home. We talked about sleep. Bruce. Mirsky does get up during the night. We talked about how much sleep Bruce [name] gets typically 2  hours at a time. We talked about coping strategies. We talked about family dynamics says they have no children though Bruce [name] brother and wife does help. We talked about safety with increase skill level. Bruce [name] in agreement to start process of looking at possibilities. Discuss the role of palliative care and plan of care. Offered palliative care social worker to contact but at this time Bruce [name] wants to wait so she can process in her mind more the idea of placement. We talked more about the PACE program although Bruce Hepburn endorses that the difficulty in the evenings in the worsening with the behaviors locked memory  care placement may be a better option. We talked about next palliative care visit will schedule for June 18th after Bruce Doy Hutching appointment for further discussion of options. Discuss with Bruce [name] that she can call sooner should she make decisions or has questions. Bruce [name] in agreement. Therapeutic listening an emotional support provided, contact information. Questions answer to satisfaction. Palliative Care was asked to help to continue to address goals of care.   CODE STATUS: DNR  PPS: 50% HOSPICE ELIGIBILITY/DIAGNOSIS: TBD  PAST MEDICAL HISTORY:  Past Medical History:  Diagnosis Date  . Alzheimer's dementia ([place])   . Hyperlipidemia   . Hypertension     SOCIAL HX:  Social History   Tobacco Use  . Smoking status: Never Smoker  . Smokeless tobacco: Never Used  Substance Use Topics  . Alcohol use: Yes    ALLERGIES: No Known Allergies   PERTINENT MEDICATIONS:  Outpatient Encounter Medications as of 06/06/2019  Medication Sig  . amitriptyline (ELAVIL) 25 MG tablet Take 25 mg by mouth at bedtime.  . diphenhydrAMINE (BENADRYL) 25 mg capsule Take 1 capsule by mouth every 6 (six) hours as needed.  . docusate sodium (COLACE) 100 MG capsule Take 1 capsule by mouth 2 (two) times daily.  Marland Kitchen doxazosin (CARDURA) 4 MG tablet Take 1 tablet by mouth daily.  Marland Kitchen glimepiride  (AMARYL) 2 MG tablet Take 1 tablet by mouth daily.  Marland Kitchen lisinopril (PRINIVIL,ZESTRIL) 20 MG tablet Take 1 tablet by mouth daily.  . memantine (NAMENDA) 5 MG tablet Take 1 tablet by mouth 2 (two) times daily.  . metFORMIN (GLUCOPHAGE) 500 MG tablet Take 1 tablet by mouth 2 (two) times daily.  . Multiple Vitamin (MULTI-VITAMINS) TABS Take 1 tablet by mouth daily.  Marland Kitchen omeprazole (PRILOSEC) 20 MG capsule Take 1 capsule by mouth daily.  . simvastatin (ZOCOR) 20 MG tablet Take 1 tablet by mouth daily.   No facility-administered encounter medications on file as of 06/06/2019.    PHYSICAL EXAM:   General: NAD, confused male Cardiovascular: regular rate and rhythm Pulmonary: clear ant fields Neurological: shuffling gait  Jese Comella Ihor Gully, NP

## 2019-06-08 ENCOUNTER — Other Ambulatory Visit (INDEPENDENT_AMBULATORY_CARE_PROVIDER_SITE_OTHER): Payer: Self-pay | Admitting: Vascular Surgery

## 2019-06-08 DIAGNOSIS — I6523 Occlusion and stenosis of bilateral carotid arteries: Secondary | ICD-10-CM

## 2019-06-13 ENCOUNTER — Ambulatory Visit (INDEPENDENT_AMBULATORY_CARE_PROVIDER_SITE_OTHER): Payer: Medicare Other | Admitting: Vascular Surgery

## 2019-06-13 ENCOUNTER — Ambulatory Visit (INDEPENDENT_AMBULATORY_CARE_PROVIDER_SITE_OTHER): Payer: Medicare Other

## 2019-06-13 ENCOUNTER — Encounter (INDEPENDENT_AMBULATORY_CARE_PROVIDER_SITE_OTHER): Payer: Self-pay | Admitting: Vascular Surgery

## 2019-06-13 ENCOUNTER — Other Ambulatory Visit: Payer: Self-pay

## 2019-06-13 VITALS — BP 131/67 | HR 57 | Resp 16 | Wt 134.8 lb

## 2019-06-13 DIAGNOSIS — I10 Essential (primary) hypertension: Secondary | ICD-10-CM

## 2019-06-13 DIAGNOSIS — I6523 Occlusion and stenosis of bilateral carotid arteries: Secondary | ICD-10-CM

## 2019-06-13 DIAGNOSIS — E785 Hyperlipidemia, unspecified: Secondary | ICD-10-CM | POA: Diagnosis not present

## 2019-06-13 DIAGNOSIS — E119 Type 2 diabetes mellitus without complications: Secondary | ICD-10-CM | POA: Diagnosis not present

## 2019-06-13 NOTE — Assessment & Plan Note (Signed)
His right carotid endarterectomy remains widely patent.  His left carotid remains in the mild range of 1 to 39%.  I had a good discussion with his wife today.  Given his age and progression of his dementia, she is fairly adamant that she would never want any intervention no matter how bad the stenosis gets and asks if we can stop doing ultrasound of his carotids.  If we are certain that we will never do any intervention, then that is certainly reasonable.  He will return on an as-needed basis at this point.

## 2019-06-13 NOTE — Progress Notes (Signed)
MRN : [id number]  Bruce [name] is a 86 y.o. ([birth year]) male who presents with chief complaint of  Chief Complaint  Patient presents with  . Follow-up    ultrasound follow up  .  History of Present Illness: Patient returns in follow-up of his carotid disease.  His Alzheimer disease has progressed and he cannot provide any history at this point.  His wife provides all the history.  He has not had any focal neurologic symptoms of arm or leg weakness or numbness, facial droop, or temporary monocular blindness. His right carotid endarterectomy remains widely patent.  His left carotid remains in the mild range of 1 to 39%.   Current Outpatient Medications  Medication Sig Dispense Refill  . amitriptyline (ELAVIL) 25 MG tablet Take 25 mg by mouth at bedtime.    . diphenhydrAMINE (BENADRYL) 25 mg capsule Take 1 capsule by mouth every 6 (six) hours as needed.    . docusate sodium (COLACE) 100 MG capsule Take 1 capsule by mouth 2 (two) times daily.    Marland Kitchen doxazosin (CARDURA) 4 MG tablet Take 1 tablet by mouth daily.    Marland Kitchen glimepiride (AMARYL) 2 MG tablet Take 1 tablet by mouth daily.    Marland Kitchen lisinopril (PRINIVIL,ZESTRIL) 20 MG tablet Take 1 tablet by mouth daily.    . memantine (NAMENDA) 5 MG tablet Take 1 tablet by mouth 2 (two) times daily.    . metFORMIN (GLUCOPHAGE) 500 MG tablet Take 1 tablet by mouth 2 (two) times daily.    . Multiple Vitamin (MULTI-VITAMINS) TABS Take 1 tablet by mouth daily.    Marland Kitchen omeprazole (PRILOSEC) 20 MG capsule Take 1 capsule by mouth daily.    . simvastatin (ZOCOR) 20 MG tablet Take 1 tablet by mouth daily.     No current facility-administered medications for this visit.    Past medical history Dementia Carotid artery stenosis BPH Diabetes Hypertension Hyperlipidemia  Past surgical history Right carotid endarterectomy about 15 years ago   Social History     Social History  Substance Use Topics  . Smoking status: Never Smoker  . Smokeless  tobacco: Never Used  . Alcohol use Yes    Family History No bleeding or clotting disorders  No Known Allergies   REVIEW OF SYSTEMS(Negative unless checked)  Constitutional: [] ?Weight loss[] ?Fever[] ?Chills Cardiac:[] ?Chest pain[] ?Chest pressure[] ?Palpitations [] ?Shortness of breath when laying flat [] ?Shortness of breath at rest [] ?Shortness of breath with exertion. Vascular: [] ?Pain in legs with walking[] ?Pain in legsat rest[] ?Pain in legs when laying flat [] ?Claudication [] ?Pain in feet when walking [] ?Pain in feet at rest [] ?Pain in feet when laying flat [] ?History of DVT [] ?Phlebitis [] ?Swelling in legs [] ?Varicose veins [] ?Non-healing ulcers Pulmonary: [] ?Uses home oxygen [] ?Productive cough[] ?Hemoptysis [] ?Wheeze [] ?COPD [] ?Asthma Neurologic: [] ?Dizziness [] ?Blackouts [] ?Seizures [] ?History of stroke [] ?History of TIA[] ?Aphasia [] ?Temporary blindness[] ?Dysphagia [] ?Weaknessor numbness in arms [] ?Weakness or numbnessin legs Xpositive for dementia Musculoskeletal: [x] ?Arthritis [] ?Joint swelling [] ?Joint pain [] ?Low back pain Hematologic:[] ?Easy bruising[] ?Easy bleeding [] ?Hypercoagulable state [] ?Anemic [] ?Hepatitis Gastrointestinal:[] ?Blood in stool[] ?Vomiting blood[] ?Gastroesophageal reflux/heartburn[] ?Difficulty swallowing. Genitourinary: [] ?Chronic kidney disease [] ?Difficulturination [x] ?Frequenturination [] ?Burning with urination[] ?Blood in urine Skin: [] ?Rashes [] ?Ulcers [] ?Wounds Psychological: [] ?History of anxiety[] ?History of major depression.      Physical Examination  Vitals:   06/13/19 1053  BP: 131/67  Pulse: (!) 57  Resp: 16  Weight: 134 lb 12.8 oz (61.1 kg)   Body mass index is 21.76 kg/m. Gen:  WD/WN, NAD Head: [place]/AT, No temporalis wasting. Ear/Nose/Throat: Hearing grossly intact, nares w/o erythema or drainage, trachea  midline Eyes: Conjunctiva clear. Sclera non-icteric Neck:  Supple.   Pulmonary:  Good air movement, equal and clear to auscultation bilaterally.  Cardiac: RRR, No JVD Vascular:  Vessel Right Left  Radial Palpable Palpable           Musculoskeletal: M/S 5/5 throughout.  No deformity or atrophy.  Neurologic: CN 2-12 intact. Sensation grossly intact in extremities.  Symmetrical.  Speech is fluent. Motor exam as listed above. Psychiatric: Judgment and insight are lacking.  Wife provides all the history. Dermatologic: No rashes or ulcers noted.  No cellulitis or open wounds.      CBC Lab Results  Component Value Date   WBC 5.4 03/06/2017   HGB 14.3 03/06/2017   HCT 42.7 03/06/2017   MCV 91.4 03/06/2017   PLT 104 (L) 03/06/2017    BMET    Component Value Date/Time   NA 135 03/06/2017 0558   K 4.2 03/06/2017 0558   CL 107 03/06/2017 0558   CO2 21 (L) 03/06/2017 0558   GLUCOSE 167 (H) 03/06/2017 0558   BUN 21 (H) 03/06/2017 0558   CREATININE 1.00 03/06/2017 0558   CALCIUM 8.5 (L) 03/06/2017 0558   GFRNONAA >60 03/06/2017 0558   GFRAA >60 03/06/2017 0558   CrCl cannot be calculated (Patient's most recent lab result is older than the maximum 21 days allowed.).  COAG Lab Results  Component Value Date   INR 0.99 03/05/2017    Radiology No results found.   Assessment/Plan Essential hypertension, benign blood pressure control important in reducing the progression of atherosclerotic disease. On appropriate oral medications.   Hyperlipidemia lipid control important in reducing the progression of atherosclerotic disease. Continue statin therapy   Diabetes ([place]) blood glucose control important in reducing the progression of atherosclerotic disease. Also, involved in wound healing. On appropriate medications.  Carotid stenosis His right carotid endarterectomy remains widely patent.  His left carotid remains in the mild range of 1 to 39%.  I had a good discussion  with his wife today.  Given his age and progression of his dementia, she is fairly adamant that she would never want any intervention no matter how bad the stenosis gets and asks if we can stop doing ultrasound of his carotids.  If we are certain that we will never do any intervention, then that is certainly reasonable.  He will return on an as-needed basis at this point.    Leotis Pain, MD  06/13/2019 11:26 AM    This note was created with Dragon medical transcription system.  Any errors from dictation are purely unintentional

## 2019-07-05 ENCOUNTER — Telehealth: Payer: Self-pay | Admitting: Nurse Practitioner

## 2019-07-05 NOTE — Telephone Encounter (Signed)
[facility] Mr. Ellwood's wife called to update that 2 weeks ago her niece help her with Mr. Kovar. At that time her niece was insistent that she needed more help and sought [facility]. [facility] evaluate and admit Mr. Beverely Pace. Mrs. Zanni talked at length about over the last two weeks she is concerned that he continues to be stable, no noted weight loss, changes in clothes size and appetite has been very good, no exacerbations. Mr Buysse does continue to ambulate but has been having more incontinent episodes. Mrs. Pardo endorses is that this "does not feel like it is working out very well with Hospice". Questions answered about Hospice Services provided under the Medicare benefit. Mrs Brigham endorses she continues to look at placement but feels like [facility] is "discouraging her about that wanting to keep services in place at home". We talked about what services Hospice is currently providing including CNA. Ms. Titsworth endorses she "feels like it is more work for her having Hospice present and causing more stress". Mrs Reinoso endorses she does not feel at this time that Hospice is helpful for her or Mr Raborn. Discussed at length with Mrs Pullara role of palliative care in plan of care. We talked about palliative care working with Mr. Ciresi for some time and was not notified from [facility] admitted him as a patient. Mrs. Schellenberg endorses that she did tell [facility] that Mr. Munday was being followed by  [facility]. Mrs Flynt endorses [facility] asked Mrs. Fitzhenry to notify palliative care and that was her reasoning for calling today. Discuss with Mrs Helwig that palliative care would not exist with Hospice. Mrs Lammert endorses that she felt more benefit with Palliative care than Hospice at this time as she feels like Mr Portal is not at end of life. We talked about options to continue with [facility], we talked about option of revoking Hospice  Services and resuming Palliative. We talked about placement versus in-Home Care. Mrs. Grealish was trying to get Mr. Cloninger into [facility] starting the process of getting on the list. Discussed at length with Mrs Vicuna and ensured that she knew these were her choices, not to be influenced by agencies that it was very important to do what she felt like was best for her and Mr Buffa and obtain services she felt most beneficial. Mrs Spang endorses she wishes not to continue Hospice Services at this time enter resume palliative care. Mrs. Radell endorses she will contact [facility] to stop services. Will keep continue original appointment scheduled for Palliative care follow up for further discussions of placement, medical goals of care. Mrs Nishikawa in agreement. Appointment scheduled confirmed. Therapeutic listening, emotional support provided. Contact information. Questions answered the satisfaction.  Total time spent 30 minutes  Documentation 5 minutes Phone discussion 25 minutes

## 2019-07-17 ENCOUNTER — Encounter: Payer: Self-pay | Admitting: Nurse Practitioner

## 2019-07-17 ENCOUNTER — Other Ambulatory Visit: Payer: Medicare Other | Admitting: Nurse Practitioner

## 2019-07-17 ENCOUNTER — Other Ambulatory Visit: Payer: Self-pay

## 2019-07-17 DIAGNOSIS — Z515 Encounter for palliative care: Secondary | ICD-10-CM

## 2019-07-17 DIAGNOSIS — F0391 Unspecified dementia with behavioral disturbance: Secondary | ICD-10-CM

## 2019-07-17 NOTE — Progress Notes (Signed)
Therapist, nutritional Palliative Care Consult Note Telephone: [phone]  Fax: [phone]  PATIENT NAME: Bruce [name] DOB: Jul 29, 1932 MRN: [id number]  PRIMARY CARE PROVIDER:   [name], MD  REFERRING PROVIDER:  [name], MD [address] [facility] [place],  [place] [address]  RESPONSIBLE PARTY:Bruce [name] [phone]  Due to the COVID-19 crisis, this visit was done via telemedicine from my office and it was initiated and consent by this patient and or family.  RECOMMENDATIONS and PLAN: 1.ACP: DNR;ongoing discussions of goc and MOST form  2.Memory lossworseningsecondary to dementia. Continue with supportive measures as chronic disease remains Progressive.  3.Unsteady gait secondary toprogression ofDementia. Continue fall precautions, high-risk.  4.Palliative care encounter Palliative medicine team will continue to support patient, patient's family, and medical team. Visit consisted of counseling and education dealing with the complex and emotionally intense issues of symptom management and palliative care in the setting of serious and potentially life-threatening illness  I spent 50 minutes providing this consultation,  from 11:00am to 11:50am. More than 50% of the time in this consultation was spent coordinating communication.   HISTORY OF PRESENT ILLNESS:  Bruce [name] is a 86 y.o. year old male with multiple medical problems including Alzheimer's dementia , Subdural hematoma, Hypertension, hyperlipidemia, carotid stenosis, diabetes. I called Mrs Ducksworth for follow-up palliative care visit completed by telemedicine is video not available. We talked about purpose of visit. Mrs Arlys John in agreement. We talked about Mr. Siebert how he has been doing. Mrs Dady endorse is that he is continuing to decline overall. Mrs Arlys John endorse is that he's beginning to have more trouble walking. Mrs Mudgett endorses that he has  now required more assistance with ADL.  she is having to dress him. Mr Bounds is having more episodes of incontinence of bowel and bladder. We talked about symptoms of pain which he does not seem to exhibit. Mrs Tuzzolino endorses that she constantly has to be behind him as sometimes he will go out the door. We talked about safety. We talked about appetite which continues to be good and he does feed himself. Mrs Kapaun endorses that she is the one herself who is losing weight. We talked at length about caregiver fatigue, stressors. We talked about previous experiences with hospice for which Ms Balandran stop services as she had felt overwhelmed and that it was not helpful at the time. We talked about services at they did provide. We talked about medical goals of care. We talked about caregiver burden and fatigue. We talked about placement which is the ultimate goal. We talked about getting in home CNA service until able to sort through placement. Mrs Marrs endorses just needs time to be able to get the financial aspect in place and paperwork together to be able to provide the facilities the information required, payment up front and planning for herself as well. We talked about what her day typically looks like which is consumed by caring for Mr. Beverely Pace. Mrs Petrasek endorses it has been very difficult to try to get anything done as she expressed some days all she can do is care for him. We talked about importance of coping strategies and rest. Mrs Prien endorses that she does have family that can come sit when she goes to the grocery store but her wishes are to have someone to come in for four to six hours a couple days a week. Mrs. Graham did get a few company names including  [employer] to look into having helped come into the home. We talked at length about the importance of trying to get resources in place to help Mrs Manning so she can further proceed with the process of placement. We talked about disease  progression with realistic expectations. Mrs Sleeth endorses that the disease progression was more rapid than what she expected or had hoped for. We talked about role of palliative care and plan of care. We talked about follow-up palliative care visit already scheduled appointment in one week. Discussed will continue to keep that appointment and gold between now and then can interview and set up in-home CNAs to help possibly four to six hours three times a week. Mrs Yeagle in agreement. Therapeutic listening and emotional support provided. Contact information. Questions answered to satisfaction.  Palliative Care was asked to help to continue to address goals of care.   CODE STATUS: DNR  PPS: 40% HOSPICE ELIGIBILITY/DIAGNOSIS: TBD  PAST MEDICAL HISTORY:  Past Medical History:  Diagnosis Date  . Alzheimer's dementia ([facility])   . Hyperlipidemia   . Hypertension     SOCIAL HX:  Social History   Tobacco Use  . Smoking status: Never Smoker  . Smokeless tobacco: Never Used  Substance Use Topics  . Alcohol use: Yes    ALLERGIES: No Known Allergies   PERTINENT MEDICATIONS:  Outpatient Encounter Medications as of 07/17/2019  Medication Sig  . amitriptyline (ELAVIL) 25 MG tablet Take 25 mg by mouth at bedtime.  . diphenhydrAMINE (BENADRYL) 25 mg capsule Take 1 capsule by mouth every 6 (six) hours as needed.  . docusate sodium (COLACE) 100 MG capsule Take 1 capsule by mouth 2 (two) times daily.  Marland Kitchen doxazosin (CARDURA) 4 MG tablet Take 1 tablet by mouth daily.  Marland Kitchen glimepiride (AMARYL) 2 MG tablet Take 1 tablet by mouth daily.  Marland Kitchen lisinopril (PRINIVIL,ZESTRIL) 20 MG tablet Take 1 tablet by mouth daily.  . memantine (NAMENDA) 5 MG tablet Take 1 tablet by mouth 2 (two) times daily.  . metFORMIN (GLUCOPHAGE) 500 MG tablet Take 1 tablet by mouth 2 (two) times daily.  . Multiple Vitamin (MULTI-VITAMINS) TABS Take 1 tablet by mouth daily.  Marland Kitchen omeprazole (PRILOSEC) 20 MG capsule Take 1 capsule by mouth  daily.  . simvastatin (ZOCOR) 20 MG tablet Take 1 tablet by mouth daily.   No facility-administered encounter medications on file as of 07/17/2019.    PHYSICAL EXAM:  Deferred  Costantino Kohlbeck Z Ondria Oswald, NP

## 2019-07-25 ENCOUNTER — Other Ambulatory Visit: Payer: Self-pay

## 2019-07-25 ENCOUNTER — Other Ambulatory Visit: Payer: Medicare Other | Admitting: Nurse Practitioner

## 2019-07-25 ENCOUNTER — Encounter: Payer: Self-pay | Admitting: Nurse Practitioner

## 2019-07-25 DIAGNOSIS — Z515 Encounter for palliative care: Secondary | ICD-10-CM

## 2019-07-25 DIAGNOSIS — F0391 Unspecified dementia with behavioral disturbance: Secondary | ICD-10-CM

## 2019-07-25 NOTE — Progress Notes (Signed)
Therapist, nutritional Palliative Care Consult Note Telephone: [phone]  Fax: [phone]  PATIENT NAME: Bruce [name] DOB: [birth year] MRN: [id number]  PRIMARY CARE PROVIDER:   [name], MD  REFERRING PROVIDER:  [name], MD [address] [facility] [place],  [place] [address]  RESPONSIBLE PARTY:Lydia Meininger [id number]  RECOMMENDATIONS and PLAN: 1.ACP: DNR;ongoing discussions of goc and MOST form  2.Memory lossworseningsecondary to dementia. Continue with supportive measures as chronic disease remains Progressive.  3.Unsteady gait secondary toprogression ofDementia. Continue fall precautions, high-risk.  4.Palliative care encounter Palliative medicine team will continue to support patient, patient's family, and medical team. Visit consisted of counseling and education dealing with the complex and emotionally intense issues of symptom management and palliative care in the setting of serious and potentially life-threatening illness  I spent 90  minutes providing this consultation,  from 2:00pm to 3:30pm. More than 50% of the time in this consultation was spent coordinating communication.   HISTORY OF PRESENT ILLNESS:  Bruce [name] is a 86 y.o. year old male with multiple medical problems including Alzheimer's dementia , Subdural hematoma, Hypertension, hyperlipidemia, carotid stenosis, diabetes. In-person follow up face-to-face palliative care visit with Mr. and Mrs. Beverely Pace. Mrs Silvera and I talked about purpose and palliative care visit. Mrs. Quadros in agreement. Mrs Sgro endorses [name] has worsened over the last several weeks. [name] has had two falls this week. Mrs Arlys John endorses [name] has bee. sleeping a lot. Mrs Arguijo endorses Mr Ringer has been urinating on himself, pulling the adult diaper down oh, urinating everywhere. Mrs Musa endorses she gets him cleaned up and then within the our Mr.  Norwood is incontinent again. Mrs. Judice endorses she is up on and off throughout the 24 hours and has been. Mrs Kirwan endorses [name] does have a skin tear on his right elbow from last fall. We talked about chronic disease progression of dementia. We talked about getting caregivers to come in to help. Mrs Som endorses she has a private duty nursing agency that scheduled to come out on Thursday stating that they can start on Monday. Mrs. Dunker endorses this will give her time to wrap her head around having somebody in her home 24 hours a day. Mrs. Burmester endorse if she continues to lose weight, feeling like she's having a hard time making decisions because she is not getting any rest. We talked about the importance of self-care. We talked about the importance of sleep, eating. Mrs Hevia endorses [name] appetite has decreased slightly though no noted wait. Mrs Mccort endorse has that she has been looking at Locked Memory Care units. Mrs. Toft has a visit set up with [facility]. Mrs Cashman endorses Dr. Judithann Sheen was competing a  FL2 to her what she does have to pick up but they were going to fax that to [place]. We talked about what a Locked Memory Care would look like. Mrs. Summer talked about the last hospitalization where [name] was given medications to make him calm down easier to deal with. Mrs. Marchiano endorses at that time he was admitted for observation not hospitalized so Medicare did not approve him to go to a short-term rehab as he did not have three days in patient. [name] did sit on the couch, slept through most of palliative care visit. [name] was smiling, Cooperative with assessment. [name] did have audible sound, congestion with crackles and his left bace. We  talked about medical goals of care. We talked about option of going to the emergency department with increased weakness, falls, respiratory symptoms, possible UTI. Mrs. Iodice endorses that [name]  does make the congestion sounds with his secretions as he is having trouble managing his secretions. Mrs Quast endorses Mr Judie Grieve continues to drool constantly. We talked about the possibility of aspiration. We talked about the option of trying to see if Dr. Judithann Sheen can see [name] in the office which is what Mrs. Schara would prefer. Call Dr. Judithann Sheen his office and they were going to try to arrange a visit for tomorrow. Currently [name] does not appear in any distress or critically ill. Mrs. Interrante agrees if symptoms worsen that can go to the emergency department. We talked about continuing with the private duty in-home service on Thursday. We also talked about continuing with [facility] but also looking at [facility] in option of [address] for a faster placement in the memory care unit. We talked about role of palliative care and plan of care. Follow-up palliative care visit set for two weeks or sooner should he declined. Appointment schedule. Mrs Erbes in  agreement. close monitoring for worsening symptoms, decline. Questions answered to satisfaction. Contact information.  Palliative Care was asked to help to continue to address goals of care.   CODE STATUS: DNR  PPS: 40% HOSPICE ELIGIBILITY/DIAGNOSIS: TBD  PAST MEDICAL HISTORY:  Past Medical History:  Diagnosis Date  . Alzheimer's dementia (HCC)   . Hyperlipidemia   . Hypertension     SOCIAL HX:  Social History   Tobacco Use  . Smoking status: Never Smoker  . Smokeless tobacco: Never Used  Substance Use Topics  . Alcohol use: Yes    ALLERGIES: No Known Allergies   PERTINENT MEDICATIONS:  Outpatient Encounter Medications as of 07/25/2019  Medication Sig  . amitriptyline (ELAVIL) 25 MG tablet Take 25 mg by mouth at bedtime.  . diphenhydrAMINE (BENADRYL) 25 mg capsule Take 1 capsule by mouth every 6 (six) hours as needed.  . docusate sodium (COLACE) 100 MG capsule Take 1 capsule by mouth 2 (two) times daily.   Marland Kitchen doxazosin (CARDURA) 4 MG tablet Take 1 tablet by mouth daily.  Marland Kitchen glimepiride (AMARYL) 2 MG tablet Take 1 tablet by mouth daily.  Marland Kitchen lisinopril (PRINIVIL,ZESTRIL) 20 MG tablet Take 1 tablet by mouth daily.  . memantine (NAMENDA) 5 MG tablet Take 1 tablet by mouth 2 (two) times daily.  . metFORMIN (GLUCOPHAGE) 500 MG tablet Take 1 tablet by mouth 2 (two) times daily.  . Multiple Vitamin (MULTI-VITAMINS) TABS Take 1 tablet by mouth daily.  Marland Kitchen omeprazole (PRILOSEC) 20 MG capsule Take 1 capsule by mouth daily.  . simvastatin (ZOCOR) 20 MG tablet Take 1 tablet by mouth daily.   No facility-administered encounter medications on file as of 07/25/2019.    PHYSICAL EXAM:   General: debilitated, confused male Cardiovascular: regular rate and rhythm Pulmonary: crackles left base Neurological: ambulatory Hedda Crumbley Prince [place], NP

## 2019-07-26 ENCOUNTER — Emergency Department: Payer: Medicare Other

## 2019-07-26 ENCOUNTER — Other Ambulatory Visit: Payer: Self-pay

## 2019-07-26 ENCOUNTER — Inpatient Hospital Stay
Admission: EM | Admit: 2019-07-26 | Discharge: 2019-08-06 | DRG: 951 | Disposition: A | Discharge status: Hospice | Payer: Medicare Other | Attending: Internal Medicine | Admitting: Internal Medicine

## 2019-07-26 DIAGNOSIS — F0390 Unspecified dementia without behavioral disturbance: Secondary | ICD-10-CM | POA: Diagnosis present

## 2019-07-26 DIAGNOSIS — Z66 Do not resuscitate: Secondary | ICD-10-CM | POA: Diagnosis present

## 2019-07-26 DIAGNOSIS — G9341 Metabolic encephalopathy: Secondary | ICD-10-CM | POA: Diagnosis present

## 2019-07-26 DIAGNOSIS — N1831 Chronic kidney disease, stage 3a: Secondary | ICD-10-CM | POA: Diagnosis present

## 2019-07-26 DIAGNOSIS — E785 Hyperlipidemia, unspecified: Secondary | ICD-10-CM | POA: Diagnosis present

## 2019-07-26 DIAGNOSIS — Z7984 Long term (current) use of oral hypoglycemic drugs: Secondary | ICD-10-CM

## 2019-07-26 DIAGNOSIS — Z79899 Other long term (current) drug therapy: Secondary | ICD-10-CM

## 2019-07-26 DIAGNOSIS — Z7189 Other specified counseling: Secondary | ICD-10-CM

## 2019-07-26 DIAGNOSIS — E1151 Type 2 diabetes mellitus with diabetic peripheral angiopathy without gangrene: Secondary | ICD-10-CM | POA: Diagnosis present

## 2019-07-26 DIAGNOSIS — R627 Adult failure to thrive: Secondary | ICD-10-CM | POA: Diagnosis present

## 2019-07-26 DIAGNOSIS — E1129 Type 2 diabetes mellitus with other diabetic kidney complication: Secondary | ICD-10-CM | POA: Diagnosis present

## 2019-07-26 DIAGNOSIS — Z515 Encounter for palliative care: Secondary | ICD-10-CM | POA: Diagnosis not present

## 2019-07-26 DIAGNOSIS — F0280 Dementia in other diseases classified elsewhere without behavioral disturbance: Secondary | ICD-10-CM | POA: Diagnosis present

## 2019-07-26 DIAGNOSIS — L309 Dermatitis, unspecified: Secondary | ICD-10-CM | POA: Diagnosis present

## 2019-07-26 DIAGNOSIS — R5381 Other malaise: Secondary | ICD-10-CM | POA: Diagnosis present

## 2019-07-26 DIAGNOSIS — E1122 Type 2 diabetes mellitus with diabetic chronic kidney disease: Secondary | ICD-10-CM | POA: Diagnosis present

## 2019-07-26 DIAGNOSIS — Y92009 Unspecified place in unspecified non-institutional (private) residence as the place of occurrence of the external cause: Secondary | ICD-10-CM

## 2019-07-26 DIAGNOSIS — Z91018 Allergy to other foods: Secondary | ICD-10-CM

## 2019-07-26 DIAGNOSIS — Z833 Family history of diabetes mellitus: Secondary | ICD-10-CM

## 2019-07-26 DIAGNOSIS — J189 Pneumonia, unspecified organism: Secondary | ICD-10-CM | POA: Diagnosis present

## 2019-07-26 DIAGNOSIS — N183 Chronic kidney disease, stage 3 unspecified: Secondary | ICD-10-CM | POA: Diagnosis present

## 2019-07-26 DIAGNOSIS — E43 Unspecified severe protein-calorie malnutrition: Secondary | ICD-10-CM | POA: Insufficient documentation

## 2019-07-26 DIAGNOSIS — I129 Hypertensive chronic kidney disease with stage 1 through stage 4 chronic kidney disease, or unspecified chronic kidney disease: Secondary | ICD-10-CM | POA: Diagnosis present

## 2019-07-26 DIAGNOSIS — F329 Major depressive disorder, single episode, unspecified: Secondary | ICD-10-CM | POA: Diagnosis present

## 2019-07-26 DIAGNOSIS — I4510 Unspecified right bundle-branch block: Secondary | ICD-10-CM | POA: Diagnosis present

## 2019-07-26 DIAGNOSIS — I10 Essential (primary) hypertension: Secondary | ICD-10-CM | POA: Diagnosis present

## 2019-07-26 DIAGNOSIS — Z20822 Contact with and (suspected) exposure to covid-19: Secondary | ICD-10-CM | POA: Diagnosis present

## 2019-07-26 DIAGNOSIS — G309 Alzheimer's disease, unspecified: Secondary | ICD-10-CM | POA: Diagnosis present

## 2019-07-26 DIAGNOSIS — I959 Hypotension, unspecified: Secondary | ICD-10-CM | POA: Diagnosis present

## 2019-07-26 DIAGNOSIS — R778 Other specified abnormalities of plasma proteins: Secondary | ICD-10-CM | POA: Diagnosis present

## 2019-07-26 DIAGNOSIS — Z6820 Body mass index (BMI) 20.0-20.9, adult: Secondary | ICD-10-CM

## 2019-07-26 DIAGNOSIS — R079 Chest pain, unspecified: Secondary | ICD-10-CM

## 2019-07-26 DIAGNOSIS — R21 Rash and other nonspecific skin eruption: Secondary | ICD-10-CM | POA: Diagnosis present

## 2019-07-26 DIAGNOSIS — R531 Weakness: Secondary | ICD-10-CM

## 2019-07-26 DIAGNOSIS — Z7982 Long term (current) use of aspirin: Secondary | ICD-10-CM

## 2019-07-26 DIAGNOSIS — F32A Depression, unspecified: Secondary | ICD-10-CM | POA: Diagnosis present

## 2019-07-26 DIAGNOSIS — W19XXXA Unspecified fall, initial encounter: Secondary | ICD-10-CM | POA: Diagnosis present

## 2019-07-26 DIAGNOSIS — K219 Gastro-esophageal reflux disease without esophagitis: Secondary | ICD-10-CM | POA: Diagnosis present

## 2019-07-26 DIAGNOSIS — Z23 Encounter for immunization: Secondary | ICD-10-CM

## 2019-07-26 LAB — BLOOD GAS, VENOUS
Acid-base deficit: 1.2 mmol/L (ref 0.0–2.0)
Bicarbonate: 25.3 mmol/L (ref 20.0–28.0)
FIO2: 0.21
O2 Saturation: 42.4 %
Patient temperature: 37
pCO2, Ven: 48 mmHg (ref 44.0–60.0)
pH, Ven: 7.33 (ref 7.250–7.430)
pO2, Ven: <31 mmHg — CL (ref 32.0–45.0)

## 2019-07-26 LAB — URINALYSIS, COMPLETE (UACMP) WITH MICROSCOPIC
Bacteria, UA: NONE SEEN
Bilirubin Urine: NEGATIVE
Glucose, UA: NEGATIVE mg/dL
Hgb urine dipstick: NEGATIVE
Ketones, ur: 20 mg/dL — AB
Leukocytes,Ua: NEGATIVE
Nitrite: NEGATIVE
Protein, ur: NEGATIVE mg/dL
Specific Gravity, Urine: 1.013 (ref 1.005–1.030)
Squamous Epithelial / HPF: NONE SEEN (ref 0–5)
pH: 5 (ref 5.0–8.0)

## 2019-07-26 LAB — CBC
HCT: 42.5 % (ref 39.0–52.0)
Hemoglobin: 14.7 g/dL (ref 13.0–17.0)
MCH: 30.4 pg (ref 26.0–34.0)
MCHC: 34.6 g/dL (ref 30.0–36.0)
MCV: 87.8 fL (ref 80.0–100.0)
Platelets: 142 10*3/uL — ABNORMAL LOW (ref 150–400)
RBC: 4.84 MIL/uL (ref 4.22–5.81)
RDW: 12.7 % (ref 11.5–15.5)
WBC: 6 10*3/uL (ref 4.0–10.5)
nRBC: 0 % (ref 0.0–0.2)

## 2019-07-26 LAB — BASIC METABOLIC PANEL
Anion gap: 13 (ref 5–15)
BUN: 20 mg/dL (ref 8–23)
CO2: 23 mmol/L (ref 22–32)
Calcium: 9.3 mg/dL (ref 8.9–10.3)
Chloride: 94 mmol/L — ABNORMAL LOW (ref 98–111)
Creatinine, Ser: 1.36 mg/dL — ABNORMAL HIGH (ref 0.61–1.24)
GFR calc Af Amer: 55 mL/min — ABNORMAL LOW (ref >60)
GFR calc non Af Amer: 47 mL/min — ABNORMAL LOW (ref >60)
Glucose, Bld: 132 mg/dL — ABNORMAL HIGH (ref 70–99)
Potassium: 4.7 mmol/L (ref 3.5–5.1)
Sodium: 130 mmol/L — ABNORMAL LOW (ref 135–145)

## 2019-07-26 LAB — TROPONIN I (HIGH SENSITIVITY)
Troponin I (High Sensitivity): 13 ng/L (ref <18)
Troponin I (High Sensitivity): 9 ng/L (ref <18)

## 2019-07-26 LAB — BRAIN NATRIURETIC PEPTIDE: B Natriuretic Peptide: 226.6 pg/mL — ABNORMAL HIGH (ref 0.0–100.0)

## 2019-07-26 LAB — GLUCOSE, CAPILLARY: Glucose-Capillary: 120 mg/dL — ABNORMAL HIGH (ref 70–99)

## 2019-07-26 MED ORDER — SODIUM CHLORIDE 0.9 % IV BOLUS
250.0000 mL | Freq: Once | INTRAVENOUS | Status: AC
  Administered 2019-07-26: 250 mL via INTRAVENOUS

## 2019-07-26 NOTE — ED Notes (Signed)
Patient is resting comfortably. 

## 2019-07-26 NOTE — ED Triage Notes (Signed)
Arrived from [facility] and sent to ER for further workup of "rattling" in lungs and low blood pressure. Multiple falls recently. Hx of dementia, baseline mentality.

## 2019-07-26 NOTE — ED Provider Notes (Signed)
ER Provider Note       Time seen: 11:44 AM    I have reviewed the vital signs and the nursing notes.  HISTORY   Chief Complaint Hypotension Level V caveat: History/ROS limited by altered mental status   HPI Bruce [name] is a 86 y.o. male with a history of dementia, hyperlipidemia, hypertension who presents today reevaluation regarding abnormal lung sounds and low blood pressure.  Patient has had multiple recent falls.  Reportedly palliative care is involved and he qualifies for hospice care.  Has a history of dementia but is at his baseline mentally.  Past Medical History:  Diagnosis Date  . Alzheimer's dementia (HCC)   . Hyperlipidemia   . Hypertension     History reviewed. No pertinent surgical history.  Allergies Patient has no known allergies.  Review of Systems Constitutional: Negative for fever. Cardiovascular: Negative for chest pain. Respiratory: Positive for difficulty breathing Neurological: Positive for weakness  All systems negative/normal/unremarkable otherwise unknown  ____________________________________________   PHYSICAL EXAM:  VITAL SIGNS: Vitals:   07/26/19 1111  BP: (!) 103/52  Pulse: (!) 59  Resp: 18  Temp: 97.6 F (36.4 C)  SpO2: 92%    Constitutional: Alert but disoriented, mild distress Eyes: Conjunctivae are normal. Normal extraocular movements. ENT      Head: Normocephalic and atraumatic.      Nose: No congestion/rhinnorhea.      Mouth/Throat: Mucous membranes are moist.      Neck: No stridor. Cardiovascular: Slow rate, regular rhythm. No murmurs, rubs, or gallops. Respiratory: Mild tachypnea with rales and rhonchi scattered bilaterally Gastrointestinal: Soft and nontender. Normal bowel sounds Musculoskeletal: Nontender with normal range of motion in extremities. No lower extremity tenderness nor edema. Neurologic:  Normal speech and language. No gross focal neurologic deficits are appreciated.  Generalized weakness,  nothing focal Skin:  Skin is warm, dry and intact. No rash noted. ____________________________________________  EKG: Interpreted by me.  Sinus bradycardia, right bundle branch block, marked T wave abnormality, normal QT  ____________________________________________   LABS (pertinent positives/negatives)  Labs Reviewed  BASIC METABOLIC PANEL - Abnormal; Notable for the following components:      Result Value   Sodium 130 (*)    Chloride 94 (*)    Glucose, Bld 132 (*)    Creatinine, Ser 1.36 (*)    GFR calc non Af Amer 47 (*)    GFR calc Af Amer 55 (*)    All other components within normal limits  CBC - Abnormal; Notable for the following components:   Platelets 142 (*)    All other components within normal limits  GLUCOSE, CAPILLARY - Abnormal; Notable for the following components:   Glucose-Capillary 120 (*)    All other components within normal limits  BLOOD GAS, VENOUS - Abnormal; Notable for the following components:   pO2, Ven <31.0 (*)    All other components within normal limits  BRAIN NATRIURETIC PEPTIDE - Abnormal; Notable for the following components:   B Natriuretic Peptide 226.6 (*)    All other components within normal limits  URINALYSIS, COMPLETE (UACMP) WITH MICROSCOPIC  CBG MONITORING, ED  TROPONIN I (HIGH SENSITIVITY)  TROPONIN I (HIGH SENSITIVITY)    RADIOLOGY  Images were viewed by me Chest x-ray IMPRESSION: No active disease.  DIFFERENTIAL DIAGNOSIS  CHF, COPD, pneumonia, COVID-19, failure to thrive, renal failure, dehydration, electrolyte abnormality  ASSESSMENT AND PLAN  Weakness, dyspnea, palliative care   Plan: The patient had presented for weakness and dyspnea. Patient's labs have  not revealed any acute process, there is mild hyponatremia.  I will gently give some saline.  There are marked new EKG abnormalities without any elevation in troponin.  Family states they cannot care for him so we will consult social work and palliative care for  admission or placement.  Daryel November MD    Note: This note was generated in part or whole with voice recognition software. Voice recognition is usually quite accurate but there are transcription errors that can and very often do occur. I apologize for any typographical errors that were not detected and corrected.     Emily Filbert, MD 07/26/19 1401

## 2019-07-26 NOTE — TOC Initial Note (Signed)
Transition of Care [facility]) - Initial/Assessment Note    Patient Details  Name: Bruce [name] MRN: [id number] Date of Birth: [birth year]  Transition of Care [facility]) CM/SW Contact:    Dupuyer Cellar, RN Phone Number: 07/26/2019, 3:48 PM  Clinical Narrative:                 Spoke to patient and patients wife at bedside. Wife is trying to get patient placed in memory care unit as she has been caring for him at home and has become overwhelmed. Patient is followed by [facility] Palliative Care team at home. Patient is incontinent of urine and wears depends. Has had both COVID vaccines. Walks with walker. Per wife patient is pleasantly confused but does wander outside of the home which is overwhelming her. Wife has sent Fl2 to [facility] and is open to other facilities.   Expected Discharge Plan: Memory Care Barriers to Discharge: Continued Medical Work up   Patient Goals and CMS Choice Patient states their goals for this hospitalization and ongoing recovery are:: Get memory care placement      Expected Discharge Plan and Services Expected Discharge Plan: Memory Care       Living arrangements for the past 2 months: Single Family Home                                      Prior Living Arrangements/Services Living arrangements for the past 2 months: Single Family Home Lives with:: Spouse Patient language and need for interpreter reviewed:: Yes Do you feel safe going back to the place where you live?: Yes      Need for Family Participation in Patient Care: Yes (Comment) Care giver support system in place?: Yes (comment) Current home services: DME (walker, wheelchair, [facility]) Criminal Activity/Legal Involvement Pertinent to Current Situation/Hospitalization: No - Comment as needed  Activities of Daily Living      Permission Sought/Granted Permission sought to share information with : Oceanographer granted to share information with : Yes, Verbal  Permission Granted     Permission granted to share info w AGENCY: Franne Grip, [facility]        Emotional Assessment Appearance:: Appears stated age Attitude/Demeanor/Rapport: Inconsistent Affect (typically observed): Accepting Orientation: : Oriented to Self Alcohol / Substance Use: Never Used Psych Involvement: No (comment)  Admission diagnosis:  hypotension and congestion Patient Active Problem List   Diagnosis Date Noted  . Subdural hematoma (HCC) 03/05/2017  . Essential hypertension, benign 06/09/2016  . Hyperlipidemia 06/09/2016  . Diabetes (HCC) 06/09/2016  . Carotid stenosis 06/09/2016   PCP:  [name], MD Pharmacy:   [facility] [address] [place] - [address] [place] [address] Phone: [phone] Fax: [phone]  [facility] - [place], [place] - [address] Renee Harder [place] [address] Phone: [phone] Fax: [phone]     Social Determinants of Health (SDOH) Interventions    Readmission Risk Interventions No flowsheet data found.

## 2019-07-26 NOTE — ED Notes (Signed)
This tech assisted pt w/ urinal, UA collected and sent to lab. Peri care provided and dry brief placed. No other needs found at this moment.

## 2019-07-27 ENCOUNTER — Encounter: Payer: Self-pay | Admitting: Primary Care

## 2019-07-27 DIAGNOSIS — R531 Weakness: Secondary | ICD-10-CM | POA: Diagnosis not present

## 2019-07-27 DIAGNOSIS — Z515 Encounter for palliative care: Secondary | ICD-10-CM | POA: Insufficient documentation

## 2019-07-27 DIAGNOSIS — Z7189 Other specified counseling: Secondary | ICD-10-CM | POA: Insufficient documentation

## 2019-07-27 DIAGNOSIS — F0390 Unspecified dementia without behavioral disturbance: Secondary | ICD-10-CM

## 2019-07-27 NOTE — ED Notes (Signed)
Pt brief wet and changed- pt placed in clean gown

## 2019-07-27 NOTE — ED Notes (Signed)
Wife left- telesitter still at bedside- offered pt food and drink but pt refused

## 2019-07-27 NOTE — ED Notes (Signed)
Sitter Caitlyn at bedside due to patient making repeated attempts to get out of bed and tele sitter not notifying this RN. Pt unable to be redirected by tele sitters attempts to tell patient to remain in bed. Pt easily redirected and responds well to Caitlyn EDT.

## 2019-07-27 NOTE — Progress Notes (Addendum)
[facility] Liaison note:  Patient is currently followed by [facility] community Palliative program at home. TOC Dagoberto Reef made aware. Plan at this time is for discharge to [facility] care pending bed availability. Thank you. Dayna Barker BSN, RN, [facility] [employer] [phone]

## 2019-07-27 NOTE — TOC Progression Note (Signed)
Transition of Care [facility]) - Progression Note    Patient Details  Name: ESTABAN MAINVILLE MRN: [id number] Date of Birth: September 15, 1932  Transition of Care [facility]) CM/SW Contact  Jagual Cellar, RN Phone Number: 07/27/2019, 9:47 AM  Clinical Narrative:    Spoke with Misty Stanley @ [facility] regarding possible placement in memory care. Misty Stanley states will check availability and requested notes be faxed to [phone]. Will complete Fl2 and fax over requested documents.    Expected Discharge Plan: Memory Care Barriers to Discharge: Continued Medical Work up  Expected Discharge Plan and Services Expected Discharge Plan: Memory Care       Living arrangements for the past 2 months: Single Family Home                                       Social Determinants of Health (SDOH) Interventions    Readmission Risk Interventions No flowsheet data found.

## 2019-07-27 NOTE — ED Notes (Signed)
Pt placed in room close to nurses station for safety. Report received from [facility].

## 2019-07-27 NOTE — ED Notes (Addendum)
Pt's wife given bowl of rice crispies and whole milk to feed pt

## 2019-07-27 NOTE — ED Notes (Signed)
Telesitter called and states pt trying to get up- this RN to room and pt had taken off blanket and gown and was attempting to take off brief- pt brief checked and is dry- assisted pt to get dressed again and gave pt a fiddle lap pad to try to distract pt from climbing out of bed

## 2019-07-27 NOTE — ED Notes (Signed)
Caitlyn EDT sitting with patient at this time due to patient making repeated attempts to swing legs off side of bed. Pt not redirected by telesitter.

## 2019-07-27 NOTE — TOC Progression Note (Signed)
Transition of Care [facility]) - Progression Note    Patient Details  Name: Bruce [name] MRN: [id number] Date of Birth: [birth year]  Transition of Care [facility]) CM/SW Contact  [place] Cellar, RN Phone Number: 07/27/2019, 3:23 PM  Clinical Narrative:    Spoke to patient and wife at bedside with Misty Stanley from [facility] on speaker phone. Misty Stanley confirmed with wife she is planning to come assess patient today with potential for transfer today if accepted. Wife agreed and requested Clinical research associate outreach to [employer] as well as a backup.   RN CM left voicemail with [facility] to confirm bed availability.    Expected Discharge Plan: Memory Care Barriers to Discharge: Continued Medical Work up  Expected Discharge Plan and Services Expected Discharge Plan: Memory Care       Living arrangements for the past 2 months: Single Family Home                                       Social Determinants of Health (SDOH) Interventions    Readmission Risk Interventions No flowsheet data found.

## 2019-07-27 NOTE — TOC Progression Note (Signed)
Transition of Care [facility]) - Progression Note    Patient Details  Name: Bruce [name] MRN: [id number] Date of Birth: March 25, 1932  Transition of Care [facility]) CM/SW Contact  [place] Cellar, RN Phone Number: 07/27/2019, 4:48 PM  Clinical Narrative:    Sherron Monday to Misty Stanley @ [facility] who requested order for wheelchair, PT/OT and needs chest xray to rule out TB. Patient will transfer to [facility] tomorrow morning.    Expected Discharge Plan: Memory Care Barriers to Discharge: Continued Medical Work up  Expected Discharge Plan and Services Expected Discharge Plan: Memory Care       Living arrangements for the past 2 months: Single Family Home                                       Social Determinants of Health (SDOH) Interventions    Readmission Risk Interventions No flowsheet data found.

## 2019-07-27 NOTE — ED Notes (Signed)
Mary RN assisted to reposition pt in bed as he was trying to scoot over side of bed

## 2019-07-27 NOTE — ED Notes (Signed)
Wife at bedside and given update

## 2019-07-27 NOTE — ED Notes (Addendum)
This RN to bedside and pt had legs over bed rail again- pt had taken brief half off and had urinated in bed- clean brief placed on pt and linens changed with Council Mechanic, ED medic- Les to stay at bedside to sit with pt for safety

## 2019-07-27 NOTE — Evaluation (Signed)
Physical Therapy Evaluation Patient Details Name: Bruce [name] MRN: [id number] DOB: 1932-03-28 Today's Date: 07/27/2019   History of Present Illness  86 y.o. male with a history of dementia, hyperlipidemia, hypertension who presents today reevaluation regarding abnormal lung sounds and low blood pressure.  Patient has had multiple recent falls.   Clinical Impression  Pt struggled with all aspects of PT exam as he was very confused t/o the session.  Per wife he has been getting progressively worse, but could do even less despite much cuing and reinforcement today.  She states that even recently he as been able to get up and walk down steps and get into a car or do some wondering in the home, but today he struggled to get to standing and even with multiple different strategies to shift weight forward and try to maintain standing he needed constant and significant assist just to keep from falling right back onto bed.  Pt is not at his functional baseline and despite his mental status being a big limiter he should be able to make some improvement with PT going forward.  Unsure if pt will be able to make the transition to RW, but it appears the effort should be made as he has been having increased falls and clearly did not show good balance or awareness with PT exam today.    Follow Up Recommendations SNF;Supervision/Assistance - 24 hour    Equipment Recommendations  None recommended by PT    Recommendations for Other Services       Precautions / Restrictions Precautions Precautions: Fall Restrictions Weight Bearing Restrictions: No      Mobility  Bed Mobility Overal bed mobility: Needs Assistance Bed Mobility: Supine to Sit;Sit to Supine     Supine to sit: Mod assist Sit to supine: Max assist   General bed mobility comments: Pt has trouble initiating movement, even with heavy and persistent cuing  Transfers Overall transfer level: Needs assistance Equipment used: Rolling walker  (2 wheeled);1 person hand held assist Transfers: Sit to/from Stand Sit to Stand: Max assist         General transfer comment: multiple attempts with and w/o AD, pt unable to shift weight forward, use walker appropriately or really make any adjustments despite much cuing.  Pt leaning backward heavily and needed constant assist just to maintain standing.   Ambulation/Gait             General Gait Details: Pt not appropriate for ambulation this date, unable to get weight forward onto walker, HHA or generally and even with much encouragmenet from PT and wife he only managed a few very unsafe staggering steps forward with struggled to safely get back to bed to be able to insure landing onto surface other than the floor.  Stairs            Wheelchair Mobility    Modified Rankin (Stroke Patients Only)       Balance Overall balance assessment: Needs assistance Sitting-balance support: Bilateral upper extremity supported Sitting balance-Leahy Scale: Poor Sitting balance - Comments: Pt leaning back most of the time, did briefly maintain balance intermittently     Standing balance-Leahy Scale: Zero Standing balance comment: Pt unable to shift weight onto toes, picking walker up when available, needing constant PT assist to keep from falling right back onto bed                             Pertinent Vitals/Pain  Pain Assessment: No/denies pain    Home Living Family/patient expects to be discharged to:: Skilled nursing facility               Home Equipment:  (has a walker, can not use appropriately)      Prior Function Level of Independence: Needs assistance   Gait / Transfers Assistance Needed: Pt apparently can at times wander (w/o AD, but typically holding walls, etc)  but per wife he has been more limited and falling lately  ADL's / Homemaking Assistance Needed: pt with severe dementia, needs help with all aspects of self care        Hand Dominance         Extremity/Trunk Assessment   Upper Extremity Assessment Upper Extremity Assessment: Difficult to assess due to impaired cognition;Generalized weakness    Lower Extremity Assessment Lower Extremity Assessment: Difficult to assess due to impaired cognition;Generalized weakness       Communication   Communication:  (unable to hold conversation, garbled speech)  Cognition Arousal/Alertness:  (confused, does not know birthday, manages to say name) Behavior During Therapy:  (pt profoundly confused) Overall Cognitive Status: History of cognitive impairments - at baseline                                        General Comments      Exercises     Assessment/Plan    PT Assessment Patient needs continued PT services  PT Problem List Decreased strength;Decreased range of motion;Decreased activity tolerance;Decreased balance;Decreased mobility;Decreased coordination;Decreased cognition;Decreased knowledge of use of DME;Decreased safety awareness       PT Treatment Interventions DME instruction;Gait training;Functional mobility training;Therapeutic activities;Therapeutic exercise;Balance training;Neuromuscular re-education;Stair training;Cognitive remediation;Patient/family education    PT Goals (Current goals can be found in the Care Plan section)  Acute Rehab PT Goals Patient Stated Goal: wife looking to possibly transition to ALF PT Goal Formulation: With family Time For Goal Achievement: 08/10/19 Potential to Achieve Goals: Fair    Frequency Min 2X/week   Barriers to discharge        Co-evaluation               AM-PAC PT "6 Clicks" Mobility  Outcome Measure Help needed turning from your back to your side while in a flat bed without using bedrails?: Total Help needed moving from lying on your back to sitting on the side of a flat bed without using bedrails?: Total Help needed moving to and from a bed to a chair (including a wheelchair)?:  Total Help needed standing up from a chair using your arms (e.g., wheelchair or bedside chair)?: Total Help needed to walk in hospital room?: Total Help needed climbing 3-5 steps with a railing? : Total 6 Click Score: 6    End of Session Equipment Utilized During Treatment: Gait belt Activity Tolerance:  (cognitive status was by far the biggest limiter) Patient left: in bed;with call bell/phone within reach;with family/visitor present   PT Visit Diagnosis: Difficulty in walking, not elsewhere classified (R26.2);Muscle weakness (generalized) (M62.81);Repeated falls (R29.6)    Time: 6599-3570 PT Time Calculation (min) (ACUTE ONLY): 21 min   Charges:   PT Evaluation $PT Eval Low Complexity: 1 Low PT Treatments $Therapeutic Activity: 8-22 mins        Malachi Pro, DPT 07/27/2019, 2:19 PM

## 2019-07-27 NOTE — TOC Progression Note (Signed)
Transition of Care [facility]) - Progression Note    Patient Details  Name: Bruce [name] MRN: [id number] Date of Birth: 20-Nov-1932  Transition of Care [facility]) CM/SW Contact  [place] Cellar, RN Phone Number: 07/27/2019, 11:53 AM  Clinical Narrative:    Sherron Monday to Misty Stanley @ [facility]-states she is coming to do assessment of patient today in ED and hopeful for transfer today. Misty Stanley states no PT eval needed for memory care.    Expected Discharge Plan: Memory Care Barriers to Discharge: Continued Medical Work up  Expected Discharge Plan and Services Expected Discharge Plan: Memory Care       Living arrangements for the past 2 months: Single Family Home                                       Social Determinants of Health (SDOH) Interventions    Readmission Risk Interventions No flowsheet data found.

## 2019-07-27 NOTE — ED Notes (Signed)
Pt in bed at this time, sitter is present in room for safety of pt.

## 2019-07-27 NOTE — ED Notes (Addendum)
Pt placed in hospital bed for comfort. Pt sleeping at this time. Tele sitter remains in place at bedside.

## 2019-07-27 NOTE — ED Notes (Signed)
Called the telesitter number and gave them my ascom number. Changed gown on patient and asked wife if she needed anything to eat or drink. She said she was fine. Patient was resting and unagitated.

## 2019-07-27 NOTE — NC FL2 (Signed)
[place] MEDICAID FL2 LEVEL OF CARE SCREENING TOOL     IDENTIFICATION  Patient Name: Bruce [name] Birthdate: 04/05/1932 Sex: male Admission Date (Current Location): 07/26/2019  [facility] and [place] Number:  Chiropodist and Address:  [facility], [address] [place], [place] [address]      Provider Number: [id number]  Attending Physician Name and Address:  No att. providers found  Relative Name and Phone Number:  Lennard Capek [phone]    Current Level of Care: Hospital Recommended Level of Care: Memory Care Prior Approval Number:    Date Approved/Denied:   PASRR Number:    Discharge Plan: Other (Comment) (Memory Care)    Current Diagnoses: Patient Active Problem List   Diagnosis Date Noted   Subdural hematoma (HCC) 03/05/2017   Essential hypertension, benign 06/09/2016   Hyperlipidemia 06/09/2016   Diabetes (HCC) 06/09/2016   Carotid stenosis 06/09/2016    Orientation RESPIRATION BLADDER Height & Weight     Self  Normal Incontinent Weight: 59 kg Height:  5\' 7"  (170.2 cm)  BEHAVIORAL SYMPTOMS/MOOD NEUROLOGICAL BOWEL NUTRITION STATUS      Continent Diet  AMBULATORY STATUS COMMUNICATION OF NEEDS Skin   Limited Assist Verbally Normal                       Personal Care Assistance Level of Assistance  Bathing, Feeding, Dressing Bathing Assistance: Limited assistance Feeding assistance: Limited assistance Dressing Assistance: Limited assistance     Functional Limitations Info             SPECIAL CARE FACTORS FREQUENCY                       Contractures      Additional Factors Info  Code Status Code Status Info: Followed by [facility] Palliative Team             Current Medications (07/27/2019):  This is the current hospital active medication list No current facility-administered medications for this encounter.   Current Outpatient Medications  Medication Sig Dispense Refill    amitriptyline (ELAVIL) 25 MG tablet Take 25 mg by mouth at bedtime.     aspirin EC 81 MG tablet Take 81 mg by mouth daily. Swallow whole.     diphenhydrAMINE (BENADRYL) 25 mg capsule Take 1 capsule by mouth every 6 (six) hours as needed.     docusate sodium (COLACE) 100 MG capsule Take 1 capsule by mouth 2 (two) times daily.     doxazosin (CARDURA) 4 MG tablet Take 1 tablet by mouth daily.     glimepiride (AMARYL) 2 MG tablet Take 1 tablet by mouth daily.     levocetirizine (XYZAL) 5 MG tablet TAKE ONE TABLET EACH EVENING     lisinopril (PRINIVIL,ZESTRIL) 20 MG tablet Take 1 tablet by mouth daily.     melatonin 5 MG TABS Take 5 mg by mouth.     metFORMIN (GLUCOPHAGE) 500 MG tablet Take 1 tablet by mouth 2 (two) times daily.     Multiple Vitamin (MULTI-VITAMINS) TABS Take 1 tablet by mouth daily.     omeprazole (PRILOSEC) 20 MG capsule Take 1 capsule by mouth daily.     simvastatin (ZOCOR) 20 MG tablet Take 1 tablet by mouth daily.     tamsulosin (FLOMAX) 0.4 MG CAPS capsule Take 0.4 mg by mouth daily.     traZODone (DESYREL) 100 MG tablet SMARTSIG:1 Tablet(s) By Mouth     memantine (NAMENDA)  10 MG tablet Take 10 mg by mouth daily. (Patient not taking: Reported on 07/26/2019)     memantine (NAMENDA) 5 MG tablet Take 1 tablet by mouth 2 (two) times daily. (Patient not taking: Reported on 07/26/2019)       Discharge Medications: Please see discharge summary for a list of discharge medications.  Relevant Imaging Results:  Relevant Lab Results:   Additional Information SS#[id number]  Sauget Cellar, RN

## 2019-07-27 NOTE — ED Notes (Addendum)
Pt continually trying to get out of bed with legs over side rails- pt repositioned

## 2019-07-27 NOTE — Consult Note (Signed)
Consultation Note Date: 07/27/2019   Patient Name: Bruce [name]  DOB: [birth year]  MRN: [id number]  Age / Sex: 86 y.o., male  PCP: Idelle Crouch, MD Referring Physician: No att. providers found  Reason for Consultation: Establishing goals of care and Psychosocial/spiritual support  HPI/Patient Profile: 86 y.o. male  with past medical history of dementia, HTN/HLD, subdural hematoma status post fall, carotid stenosis, diabetes, admitted on 07/26/2019 with unsteady gait, placement in memory care unit.   Clinical Assessment and Goals of Care: I have reviewed medical records including EPIC notes, labs and imaging, received report from ED physician, examined the patient and met at bedside with wife of 24 years, Mardell Cragg, to discuss diagnosis prognosis, GOC, EOL wishes, disposition and options.  I introduced Palliative Medicine as specialized medical care for people living with serious illness. It focuses on providing relief from the symptoms and stress of a serious illness.  Mrs. Shad is familiar with palliative services, they are very active with [place] outpatient palliative, and were just seen by palliative NP 7/13.  We discussed a brief life review of the patient.  Mr. and Mrs. Guardado have been married for 63 years.  They have no children.  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  They are active with her church community, and have support from neighbors.  Mrs. Shell shares that she leans on her sister in Mr. Harbaugh brother for support.  As far as functional and nutritional status, Mrs. Ravenscroft shares that over the past month she has noticed Mr. Holaway having difficulty in putting too much food in his mouth at one time and managing the food in his mouth.  He is "messy".  We talked about risk for aspiration pneumonia, the nutritional changes that occur as dementia progresses.  We discussed current  illness and what it means in the larger context of on-going co-morbidities.  Natural disease trajectory and expectations at EOL were discussed.  I attempted to elicit values and goals of care important to the patient.    The difference between aggressive medical intervention and comfort care was considered in light of the patient's goals of care.  We talk about the chronic and progressive nature of dementia.  Mrs. Blais states that she started to see changes in her husband about 10 years ago.  She shares that there have been many declines over the last year, and she shares her angst over her inability to continue to care for her husband at home.  Advanced directives, concepts specific to code status, artifical feeding and hydration, and rehospitalization were considered and discussed.  Mrs. Elsass states that she would not want a tube to feed Mr. Blethen, she believes that he would pull out a feeding tube.  We talk about the concept of let nature take it's course, and I give an example.   The Sasso family is active with [facility] outpatient palliative services, this would continue at [facility].  Hospice and Palliative Care services outpatient were explained and offered.  Questions and concerns were  addressed.  The family was encouraged to call with questions or concerns.   Conference with ER attending, bedside nursing staff, transition of care team related to patient condition, needs, disposition issues.  HCPOA    NEXT OF KIN -wife of 27 years, Francois Elk.  She tells me that they have no children, but she leans on her sister, and Mr. O'Brien's brother for support.  She also shared that they have a church family.    SUMMARY OF RECOMMENDATIONS   At this point continue to treat the treatable but no CPR or intubation. Active with outpatient palliative services, [facility]. We discussed the concept of "let nature take its course".   Code Status/Advance Care Planning:  DNR  Symptom  Management:   Per ED physician, no additional needs at this time.  Palliative Prophylaxis:   Oral Care and Turn Reposition  Additional Recommendations (Limitations, Scope, Preferences):  Treat the treatable but allowing natural passing.  No CPR or intubation, no tube feeding  Psycho-social/Spiritual:   Desire for further Chaplaincy support:no  Additional Recommendations: Caregiving  Support/Resources and Education on Hospice  Prognosis:   < 6 months, would not be surprising based on chronic illness burden, in particular late stage dementia with difficulty managing food bolus, poor mobility and balance, risk for illness and injury after being placed in a new facility.  Discharge Planning: Seeking placement in memory care unit at [facility].      Primary Diagnoses: Present on Admission: **None**   I have reviewed the medical record, interviewed the patient and family, and examined the patient. The following aspects are pertinent.  Past Medical History:  Diagnosis Date  . Alzheimer's dementia ([place])   . Hyperlipidemia   . Hypertension    Social History   Socioeconomic History  . Marital status: Married    Spouse name: Not on file  . Number of children: Not on file  . Years of education: Not on file  . Highest education level: Not on file  Occupational History  . Not on file  Tobacco Use  . Smoking status: Never Smoker  . Smokeless tobacco: Never Used  Substance and Sexual Activity  . Alcohol use: Yes  . Drug use: Not on file  . Sexual activity: Not on file  Other Topics Concern  . Not on file  Social History Narrative  . Not on file   Social Determinants of Health   Financial Resource Strain:   . Difficulty of Paying Living Expenses:   Food Insecurity:   . Worried About Charity fundraiser in the Last Year:   . Arboriculturist in the Last Year:   Transportation Needs:   . Film/video editor (Medical):   Marland Kitchen Lack of Transportation (Non-Medical):     Physical Activity:   . Days of Exercise per Week:   . Minutes of Exercise per Session:   Stress:   . Feeling of Stress :   Social Connections:   . Frequency of Communication with Friends and Family:   . Frequency of Social Gatherings with Friends and Family:   . Attends Religious Services:   . Active Member of Clubs or Organizations:   . Attends Archivist Meetings:   Marland Kitchen Marital Status:    History reviewed. No pertinent family history. Scheduled Meds: Continuous Infusions: PRN Meds:. Medications Prior to Admission:  Prior to Admission medications   Medication Sig Start Date End Date Taking? Authorizing Provider  amitriptyline (ELAVIL) 25 MG tablet Take 25 mg by  mouth at bedtime.   Yes [provider]  aspirin EC 81 MG tablet Take 81 mg by mouth daily. Swallow whole.   Yes [provider]  diphenhydrAMINE (BENADRYL) 25 mg capsule Take 1 capsule by mouth every 6 (six) hours as needed.   Yes [provider]  docusate sodium (COLACE) 100 MG capsule Take 1 capsule by mouth 2 (two) times daily.   Yes [provider]  doxazosin (CARDURA) 4 MG tablet Take 1 tablet by mouth daily. 05/24/15  Yes [provider]  glimepiride (AMARYL) 2 MG tablet Take 1 tablet by mouth daily. 01/14/16  Yes [provider]  levocetirizine (XYZAL) 5 MG tablet TAKE ONE TABLET EACH EVENING 06/19/19  Yes [provider]  lisinopril (PRINIVIL,ZESTRIL) 20 MG tablet Take 1 tablet by mouth daily. 11/20/15  Yes [provider]  melatonin 5 MG TABS Take 5 mg by mouth.   Yes [provider]  metFORMIN (GLUCOPHAGE) 500 MG tablet Take 1 tablet by mouth 2 (two) times daily. 11/19/15  Yes [provider]  Multiple Vitamin (MULTI-VITAMINS) TABS Take 1 tablet by mouth daily.   Yes [provider]  omeprazole (PRILOSEC) 20 MG capsule Take 1 capsule by mouth daily.   Yes [provider]  simvastatin (ZOCOR) 20 MG tablet  Take 1 tablet by mouth daily. 12/11/15  Yes [provider]  tamsulosin (FLOMAX) 0.4 MG CAPS capsule Take 0.4 mg by mouth daily. 06/19/19  Yes [provider]  traZODone (DESYREL) 100 MG tablet SMARTSIG:1 Tablet(s) By Mouth 06/27/19  Yes [provider]  memantine (NAMENDA) 10 MG tablet Take 10 mg by mouth daily. Patient not taking: Reported on 07/26/2019 03/30/19   [provider]  memantine (NAMENDA) 5 MG tablet Take 1 tablet by mouth 2 (two) times daily. Patient not taking: Reported on 07/26/2019 01/14/16   [provider]   No Known Allergies Review of Systems  Unable to perform ROS: Dementia    Physical Exam Vitals and nursing note reviewed.  Constitutional:      General: He is not in acute distress.    Appearance: He is ill-appearing.  HENT:     Head: Atraumatic.  Cardiovascular:     Rate and Rhythm: Normal rate.  Pulmonary:     Effort: Pulmonary effort is normal. No respiratory distress.  Abdominal:     General: Abdomen is flat.     Palpations: Abdomen is soft.  Musculoskeletal:        General: No swelling.  Skin:    General: Skin is warm and dry.     Coloration: Skin is pale.  Neurological:     Mental Status: He is alert.     Comments: Known dementia   Psychiatric:     Comments: Calm and cooperative      Vital Signs: BP 118/68 (BP Location: Right Arm)   Pulse 66   Temp 98.1 F (36.7 C) (Oral)   Resp 16   Ht '5\' 7"'  (1.702 m)   Wt 59 kg   SpO2 94%   BMI 20.36 kg/m  Pain Scale: PAINAD       SpO2: SpO2: 94 % O2 Device:SpO2: 94 % O2 Flow Rate: .   IO: Intake/output summary:   Intake/Output Summary (Last 24 hours) at 07/27/2019 1222 Last data filed at 07/26/2019 2137 Gross per 24 hour  Intake 999 ml  Output 200 ml  Net 799 ml    LBM:   Baseline Weight: Weight: 59 kg Most recent  weight: Weight: 59 kg     Palliative Assessment/Data:   Flowsheet Rows     Most Recent Value  Intake Tab  Referral Department  Critical care  Unit at Time of Referral ER  Palliative Care Primary Diagnosis Neurology  Date Notified 07/26/19  Palliative Care Type New Palliative care  Reason for referral Clarify Goals of Care  Date of Admission 07/26/19  Date first seen by Palliative Care 07/27/19  # of days Palliative referral response time 1 Day(s)  # of days IP prior to Palliative referral 0  Clinical Assessment  Palliative Performance Scale Score 40%  Pain Max last 24 hours Not able to report  Pain Min Last 24 hours Not able to report  Dyspnea Max Last 24 Hours Not able to report  Dyspnea Min Last 24 hours Not able to report  Psychosocial & Spiritual Assessment  Palliative Care Outcomes      Time In: 1040 Time Out: 1150 Time Total: 50 minutes  Greater than 50%  of this time was spent counseling and coordinating care related to the above assessment and plan.  Signed by: Drue Novel, NP   Please contact Palliative Medicine Team phone at [phone] for questions and concerns.  For individual provider: See Shea Evans

## 2019-07-28 ENCOUNTER — Emergency Department: Payer: Medicare Other

## 2019-07-28 DIAGNOSIS — R531 Weakness: Secondary | ICD-10-CM | POA: Diagnosis not present

## 2019-07-28 DIAGNOSIS — Z515 Encounter for palliative care: Secondary | ICD-10-CM | POA: Diagnosis not present

## 2019-07-28 DIAGNOSIS — Z7189 Other specified counseling: Secondary | ICD-10-CM | POA: Diagnosis not present

## 2019-07-28 DIAGNOSIS — F0390 Unspecified dementia without behavioral disturbance: Secondary | ICD-10-CM | POA: Diagnosis not present

## 2019-07-28 MED ORDER — METFORMIN HCL 500 MG PO TABS: 500.0000 mg | ORAL_TABLET | Freq: Two times a day (BID) | ORAL | Status: DC

## 2019-07-28 MED ORDER — AMITRIPTYLINE HCL 50 MG PO TABS: 25.0000 mg | ORAL_TABLET | Freq: Every evening | ORAL | Status: DC | PRN

## 2019-07-28 MED ORDER — DOCUSATE SODIUM 100 MG PO CAPS
100.0000 mg | ORAL_CAPSULE | Freq: Two times a day (BID) | ORAL | Status: DC
  Administered 2019-07-28 – 2019-08-02 (×7): 100 mg via ORAL
  Filled 2019-07-28 (×7): qty 1

## 2019-07-28 MED ORDER — TAMSULOSIN HCL 0.4 MG PO CAPS: 0.4000 mg | ORAL_CAPSULE | Freq: Every day | ORAL | Status: DC

## 2019-07-28 MED ORDER — AMITRIPTYLINE HCL 25 MG PO TABS
25.0000 mg | ORAL_TABLET | Freq: Every day | ORAL | Status: DC
  Administered 2019-07-28 – 2019-08-01 (×3): 25 mg via ORAL
  Filled 2019-07-28 (×3): qty 1

## 2019-07-28 MED ORDER — LISINOPRIL 10 MG PO TABS: 20.0000 mg | ORAL_TABLET | Freq: Every day | ORAL | Status: DC

## 2019-07-28 MED ORDER — PANTOPRAZOLE SODIUM 40 MG PO TBEC
40.0000 mg | DELAYED_RELEASE_TABLET | Freq: Every day | ORAL | Status: DC
  Administered 2019-07-28 – 2019-08-02 (×4): 40 mg via ORAL
  Filled 2019-07-28 (×4): qty 1

## 2019-07-28 MED ORDER — ASPIRIN EC 81 MG PO TBEC: 81.0000 mg | DELAYED_RELEASE_TABLET | Freq: Every day | ORAL | Status: DC

## 2019-07-28 MED ORDER — CALCIUM CARBONATE ANTACID 500 MG PO CHEW: 1.0000 | CHEWABLE_TABLET | Freq: Three times a day (TID) | ORAL | Status: DC | PRN

## 2019-07-28 MED ORDER — DOXAZOSIN MESYLATE 4 MG PO TABS: 4.0000 mg | ORAL_TABLET | Freq: Every day | ORAL | Status: DC

## 2019-07-28 MED ORDER — AMITRIPTYLINE HCL 50 MG PO TABS: 25.0000 mg | ORAL_TABLET | Freq: Every day | ORAL | Status: DC

## 2019-07-28 MED ORDER — DOCUSATE SODIUM 100 MG PO CAPS: 100.0000 mg | ORAL_CAPSULE | Freq: Two times a day (BID) | ORAL | Status: DC

## 2019-07-28 MED ORDER — LISINOPRIL 10 MG PO TABS: 10.0000 mg | ORAL_TABLET | Freq: Every day | ORAL | Status: DC

## 2019-07-28 MED ORDER — ASPIRIN EC 81 MG PO TBEC
81.0000 mg | DELAYED_RELEASE_TABLET | Freq: Every day | ORAL | Status: DC
  Administered 2019-07-28 – 2019-08-02 (×4): 81 mg via ORAL
  Filled 2019-07-28 (×4): qty 1

## 2019-07-28 MED ORDER — MELATONIN 5 MG PO TABS: 5.0000 mg | ORAL_TABLET | Freq: Every day | ORAL | Status: DC

## 2019-07-28 MED ORDER — GLIMEPIRIDE 2 MG PO TABS
2.0000 mg | ORAL_TABLET | Freq: Every day | ORAL | Status: DC
  Administered 2019-07-28: 2 mg via ORAL
  Filled 2019-07-28 (×4): qty 1

## 2019-07-28 MED ORDER — SIMVASTATIN 20 MG PO TABS
20.0000 mg | ORAL_TABLET | Freq: Every day | ORAL | Status: DC
  Administered 2019-07-28 – 2019-08-02 (×4): 20 mg via ORAL
  Filled 2019-07-28: qty 2
  Filled 2019-07-28 (×3): qty 1

## 2019-07-28 MED ORDER — GLIMEPIRIDE 2 MG PO TABS: 2.0000 mg | ORAL_TABLET | Freq: Every day | ORAL | Status: DC

## 2019-07-28 MED ORDER — DOXAZOSIN MESYLATE 4 MG PO TABS
4.0000 mg | ORAL_TABLET | Freq: Every day | ORAL | Status: DC
  Filled 2019-07-28: qty 1

## 2019-07-28 MED ORDER — METFORMIN HCL 500 MG PO TABS
500.0000 mg | ORAL_TABLET | Freq: Two times a day (BID) | ORAL | Status: DC
  Administered 2019-07-28 (×2): 500 mg via ORAL
  Filled 2019-07-28 (×2): qty 1

## 2019-07-28 MED ORDER — TRAZODONE HCL 50 MG PO TABS
50.0000 mg | ORAL_TABLET | Freq: Every day | ORAL | Status: DC
  Administered 2019-07-28 – 2019-08-01 (×3): 50 mg via ORAL
  Filled 2019-07-28 (×3): qty 1

## 2019-07-28 MED ORDER — MELATONIN 5 MG PO TABS
5.0000 mg | ORAL_TABLET | Freq: Every day | ORAL | Status: DC
  Administered 2019-07-31 – 2019-08-01 (×2): 5 mg via ORAL
  Filled 2019-07-28 (×6): qty 1

## 2019-07-28 NOTE — ED Notes (Signed)
X-ray at bedside

## 2019-07-28 NOTE — ED Notes (Addendum)
[facility] - [phone], Memory Care Unit, Room#89, Report# [phone]  Pt to be transported tomorrow after transport is set up by [place] per social work.

## 2019-07-28 NOTE — TOC Progression Note (Deleted)
Patient has, subdural hematoma and carotid stenosis, which requires head to be positioned in ways not feasible with a normal bed. Head must be elevated at least 30 or more and needs repositioning regularly.  Patient suffers from subdural hematoma and carotid stenosis, which impairs his ability to perform daily activities, like meal preparation, toileting, and home making in the home. A walker will not resolve issue, with performing activities of daily living. A wheel chair will allow patient so safely perform daily activities. Patient is not able to propel themselves in the home using a standard weight wheel chair due to weakness. Patient can self-propel in the light weight wheelchair.                                                                            Length of need 99.  Accessories: elevating leg rest (ELRs), wheel locks, extensions and anti-tippers, and back cushion.

## 2019-07-28 NOTE — ED Notes (Signed)
Pt fed a cup of applesauce by this RN. Pt tolerated well.

## 2019-07-28 NOTE — ED Notes (Signed)
Pt cleaned of urine by Waynetta Sandy, Jeremy Johann

## 2019-07-28 NOTE — ED Provider Notes (Signed)
-----------------------------------------   5:43 AM on 07/28/2019 -----------------------------------------  No acute overnight events. The patient is reportedly to be discharged later today to [facility].   Loleta Rose, MD 07/28/19 [phone]

## 2019-07-28 NOTE — ED Notes (Signed)
Pt ate 80% of his lunch with no issue.

## 2019-07-28 NOTE — TOC Progression Note (Signed)
Transition of Care [facility]) - Progression Note    Patient Details  Name: Bruce [name] MRN: [id number] Date of Birth: 05/24/1932  [facility]) CM/SW Contact  Marina Goodell Phone Number: [phone] 07/28/2019, 4:20 PM  Clinical Narrative:     Patient will d/c tomorrow 07/29/19 to [facility] [address] Memory Care Unit, Room# [place], Report # [phone] as for Med Tech. This CSW will contact ED Secretary tomorrow morning to confirm information. Patient will need DME orders for bed and wheelchair.  Expected Discharge Plan: Memory Care Barriers to Discharge: Continued Medical Work up  Expected Discharge Plan and Services Expected Discharge Plan: Memory Care       Living arrangements for the past 2 months: Single Family Home                                       Social Determinants of Health (SDOH) Interventions    Readmission Risk Interventions No flowsheet data found.

## 2019-07-28 NOTE — ED Notes (Signed)
Pt teeth brushed and face washed by this RN. Pt tolerated well. Bed low, alarm set.

## 2019-07-28 NOTE — TOC Progression Note (Cosign Needed)
Patient has, subdural hematoma and carotid stenosis, which requires head to be positioned in ways not feasible with a normal bed. Head must be elevated at least 30 or more and needs repositioning regularly.  Patient suffers from subdural hematoma and carotid stenosis, which impairs his ability to perform daily activities, like meal preparation, toileting, and home making in the home. A walker will not resolve issue, with performing activities of daily living. A wheel chair will allow patient so safely perform daily activities. Patient is not able to propel themselves in the home using a standard weight wheel chair due to weakness. Patient can self-propel in the light weight wheelchair.                                                                            Length of need 99.  Accessories: elevating leg rest (ELRs), wheel locks, extensions and anti-tippers, and back cushion.                                                                                                                  

## 2019-07-28 NOTE — TOC Progression Note (Signed)
Transition of Care [facility]) - Progression Note    Patient Details  Name: Bruce [name] MRN: [id number] Date of Birth: 1932/08/07  Transition of Care [facility]) CM/SW Contact  Marina Goodell Phone Number: [phone] 07/28/2019, 12:03 PM  Clinical Narrative:     CSW spoke with Misty Stanley at [facility], for status on patient placement.  Misty Stanley stated the patient will need an x-ray which clearly states the patient does not have signs of TB, a hospital bed and wheels chair DME order and transportation paperwork sign.  Misty Stanley stated she would email the paperwork to this CSW.  Chip Boer will provide transportation.   This CSW spoke with Zack @ [employer] who will supply the bed and wheelchair once he receives the order.    Expected Discharge Plan: Memory Care Barriers to Discharge: Continued Medical Work up  Expected Discharge Plan and Services Expected Discharge Plan: Memory Care       Living arrangements for the past 2 months: Single Family Home                                       Social Determinants of Health (SDOH) Interventions    Readmission Risk Interventions No flowsheet data found.

## 2019-07-28 NOTE — Progress Notes (Signed)
Palliative: [name] is lying quietly on the stretcher in the ED.  He will make an somewhat keep eye contact.  He is appears acutely/chronically ill and quite frail.  He is pale.  He has known dementia, but is able to make his basic needs known.  Wife is not present at bedside at this time.  He attempted to get out of bed unaided last night and was assigned a sitter for his safety.  He is more calm this morning.  He denies issues or needs at this time.  Conference with bedside nursing staff and transition of care team related to patient condition, needs, restarting of home medication, disposition plan.  Plan:   [name] is to transition to [facility] memory care unit.  He is "treat the treatable", but no CPR or intubation (DNR).  [name] is active with a [facility] palliative services.  His wife tells me that she understands that things are changing, but she is not ready for hospice care yet.  25 minutes Bruce Carmel, NP Palliative medicine team Team phone [phone] Greater than 50% of this time was spent counseling and coordinating care related to the above assessment and plan.

## 2019-07-28 NOTE — TOC Progression Note (Signed)
Transition of Care [facility]) - Progression Note    Patient Details  Name: Bruce [name] MRN: [id number] Date of Birth: Jun 07, 1932  Transition of Care [facility]) CM/SW Contact  Marina Goodell Phone Number: [phone] 07/28/2019, 9:39 AM  Clinical Narrative:     CSW called and left voicemail for Misty Stanley @ [facility] [phone] for update on patient placement and transport.    Expected Discharge Plan: Memory Care Barriers to Discharge: Continued Medical Work up  Expected Discharge Plan and Services Expected Discharge Plan: Memory Care       Living arrangements for the past 2 months: Single Family Home                                       Social Determinants of Health (SDOH) Interventions    Readmission Risk Interventions No flowsheet data found.

## 2019-07-28 NOTE — ED Provider Notes (Signed)
Patient has, subdural hematoma and carotid stenosis,which requiresheadto be positioned in ways not feasible with a normal bed. Head must be elevated at least 30or more and needs repositioning regularly.  Patient suffers from subdural hematoma and carotid stenosis, which impairs his ability to perform daily activities, like meal preparation, toileting, and home making in the home. A walker will not resolve issue, with performing activities of daily living. A wheel chair will allow patient so safely perform daily activities. Patient is not able to propel themselves in the home using a standard weight wheel chair due to weakness. Patient can self-propel in the light weight wheelchair.   Length of need 99.  Accessories: elevating leg rest (ELRs), wheel locks, extensions and anti-tippers, and back cushion.   Willy Eddy, MD 07/28/19 [phone]

## 2019-07-29 ENCOUNTER — Emergency Department: Payer: Medicare Other

## 2019-07-29 LAB — CBC WITH DIFFERENTIAL/PLATELET
Abs Immature Granulocytes: 0.03 10*3/uL (ref 0.00–0.07)
Basophils Absolute: 0 10*3/uL (ref 0.0–0.1)
Basophils Relative: 1 %
Eosinophils Absolute: 0.1 10*3/uL (ref 0.0–0.5)
Eosinophils Relative: 2 %
HCT: 47.5 % (ref 39.0–52.0)
Hemoglobin: 16.4 g/dL (ref 13.0–17.0)
Immature Granulocytes: 1 %
Lymphocytes Relative: 31 %
Lymphs Abs: 1.9 10*3/uL (ref 0.7–4.0)
MCH: 30.3 pg (ref 26.0–34.0)
MCHC: 34.5 g/dL (ref 30.0–36.0)
MCV: 87.6 fL (ref 80.0–100.0)
Monocytes Absolute: 0.6 10*3/uL (ref 0.1–1.0)
Monocytes Relative: 10 %
Neutro Abs: 3.5 10*3/uL (ref 1.7–7.7)
Neutrophils Relative %: 55 %
Platelets: 147 10*3/uL — ABNORMAL LOW (ref 150–400)
RBC: 5.42 MIL/uL (ref 4.22–5.81)
RDW: 12.7 % (ref 11.5–15.5)
WBC: 6.2 10*3/uL (ref 4.0–10.5)
nRBC: 0 % (ref 0.0–0.2)

## 2019-07-29 LAB — URINALYSIS, COMPLETE (UACMP) WITH MICROSCOPIC
Bacteria, UA: NONE SEEN
Bilirubin Urine: NEGATIVE
Glucose, UA: NEGATIVE mg/dL
Ketones, ur: 5 mg/dL — AB
Leukocytes,Ua: NEGATIVE
Nitrite: NEGATIVE
Protein, ur: 30 mg/dL — AB
Specific Gravity, Urine: 1.021 (ref 1.005–1.030)
Squamous Epithelial / HPF: NONE SEEN (ref 0–5)
pH: 5 (ref 5.0–8.0)

## 2019-07-29 LAB — SARS CORONAVIRUS 2 BY RT PCR (HOSPITAL ORDER, PERFORMED IN ~~LOC~~ HOSPITAL LAB): SARS Coronavirus 2: NEGATIVE

## 2019-07-29 LAB — COMPREHENSIVE METABOLIC PANEL
ALT: 25 U/L (ref 0–44)
AST: 34 U/L (ref 15–41)
Albumin: 4.2 g/dL (ref 3.5–5.0)
Alkaline Phosphatase: 54 U/L (ref 38–126)
Anion gap: 13 (ref 5–15)
BUN: 20 mg/dL (ref 8–23)
CO2: 23 mmol/L (ref 22–32)
Calcium: 9.3 mg/dL (ref 8.9–10.3)
Chloride: 96 mmol/L — ABNORMAL LOW (ref 98–111)
Creatinine, Ser: 1.16 mg/dL (ref 0.61–1.24)
GFR calc Af Amer: >60 mL/min (ref >60)
GFR calc non Af Amer: 58 mL/min — ABNORMAL LOW (ref >60)
Glucose, Bld: 136 mg/dL — ABNORMAL HIGH (ref 70–99)
Potassium: 4.1 mmol/L (ref 3.5–5.1)
Sodium: 132 mmol/L — ABNORMAL LOW (ref 135–145)
Total Bilirubin: 0.8 mg/dL (ref 0.3–1.2)
Total Protein: 7 g/dL (ref 6.5–8.1)

## 2019-07-29 LAB — GLUCOSE, CAPILLARY: Glucose-Capillary: 121 mg/dL — ABNORMAL HIGH (ref 70–99)

## 2019-07-29 MED ORDER — SODIUM CHLORIDE 0.9 % IV SOLN: INTRAVENOUS | Status: AC

## 2019-07-29 NOTE — ED Notes (Signed)
Pt attempting to get out of bed. Pt's brief and clothing/linen is dry. Nothing needed from staff at this time. Pt was given a few sips as tolerated of juice. Pt adjusted in bed and raised to HOB 30 degrees. Fall alarm on bed in place, will continue to monitor.

## 2019-07-29 NOTE — ED Notes (Signed)
Patient has been cleaned and changed after In and Out was completed.

## 2019-07-29 NOTE — ED Provider Notes (Signed)
See Dr. Arlee Muslim notes from today. Briefly, pt had been set up with going to an ALF but due to his dementia, is unsafe to do so. He has been requiring essentially full assist and PT/OT have all recommended SNF. Due to his question of worsening mental status here, CT Head/CSpine obtained today and are negative. Repeat CBC, CMP reassuring. CXR shows no new PNA. UA is pending.  UA with minimal ketonuria, encouraging hydration. No signs of new UTI. Stable for SNF placement.   Shaune Pollack, MD 07/30/19 [phone]

## 2019-07-29 NOTE — TOC Transition Note (Signed)
Transition of Care [facility]) - CM/SW Discharge Note   Patient Details  Name: Bruce [name] MRN: [id number] Date of Birth: [birth year]  Transition of Care [facility]) CM/SW Contact:  Larwance Rote, LCSW Phone Number: 07/29/2019, 10:46 AM   Clinical Narrative:    Patient will d/c today, Saturday 07/29/19 to [facility] ALF [address] Memory Care Unit, Room# [place], Report # [phone] as for Med Tech. This CSW will contact ED Secretary tomorrow morning to confirm information. Patient will need DME orders for bed and wheelchair.    Barriers to Discharge: ED Barriers Resolved   Patient Goals and CMS Choice Patient states their goals for this hospitalization and ongoing recovery are:: Get memory care placement      Discharge Placement                       Discharge Plan and Services                                     Social Determinants of Health (SDOH) Interventions     Readmission Risk Interventions No flowsheet data found.

## 2019-07-29 NOTE — ED Notes (Signed)
ED MD speaking to patient family member (wife) at this time.

## 2019-07-29 NOTE — ED Notes (Signed)
Pt moving in bed, getting legs out of bed opening. Pt moved up in bed, pt not verbally responsive. Pt alert, fidgeting with cords. Pt in view of nurse's station, door open, bed alarm on.

## 2019-07-29 NOTE — ED Notes (Signed)
Falls risk bracelet and fall socks applied at this time. Pt lying in bed in NAD at this time. Pt alert. Will continue to monitor

## 2019-07-29 NOTE — ED Notes (Signed)
Pt had 1 unmeasured void, brief was changed and pt was cleaned (peri care) by this RN.

## 2019-07-29 NOTE — ED Notes (Signed)
RN has spoken to [employer].

## 2019-07-29 NOTE — ED Provider Notes (Signed)
8:15 AM Assumed care for off going team.   Blood pressure (!) 105/56, pulse (!) 51, temperature (!) 97.2 F (36.2 C), temperature source Axillary, resp. rate (!) 9, height 5\' 7"  (1.702 m), weight 59 kg, SpO2 97 %.  See their HPI for full report but in brief patient is pending placement to [facility] due to dementia.  However patient has been interactive previously with nursing staff but this morning is hard to wake with sternal rub.  Point-of-care glucose was normal.  Patient has reactive pupils bilaterally opens his eyes to sternal rub but is otherwise not interactive.  Patient had a history of a subdural hematoma back in 2019 and it sounds like he has had a lot of frequent falls more recently.  Will get repeat CT head and CT neck to evaluate for intracranial hemorrhage, cervical fracture.   Ct head negative for acute infarct. Ct cervical negative.   9:25 AM patient attempted to get out of bed but was able to be redirected.  He is not really able to swallow.  Unclear what his baseline has been in the emergency room but according to nursing staff he initially was able to eat and drink food and take pills.  10:17 AM repeat labs are stable with normal white count.  Slightly low sodium and chloride but not enough to cause altered mental status.  11:53 AM D/w 2020 from SW-patient was supposed to go to a assisted living facility memory care according to the PT note patient needs max assistance.Kandis Cocking  He was not even appropriate for ambulation.  He needs help with all aspects of self-care.  I am unclear exactly how patient will do well in assisted living.  2:12 PM patient is wife is at bedside he said this is how he has been the past 3 days since being here.  At this point I do not feel that assisted facility is appropriate and we need to call back to the facility to make sure that they are aware of patient's capabilities and that he is on full assist.  I discussed this with wife and social worker and they  are still waiting to get in contact with the facility            Marland Kitchen, MD 07/29/19 1414

## 2019-07-29 NOTE — ED Notes (Signed)
Patient has been cleaned, changed and repositioned in bed.

## 2019-07-29 NOTE — ED Notes (Signed)
Patient has been showing signs of deterioration. Patient is not drinking, swallowing, or eating with assistance. When asking another RN that worked Thursday, she stated that patient is presenting with new signs of deterioration.  Social worker has been called. RN and MD will collaborate with Social worker on new findings and discuss possibility of more advanced care.  Per Social Work: Patient will d/c today, Saturday 07/29/19 to [facility] ALF [address] Memory Care Unit, Room# [place], Report # [phone] as for Med Tech. Patient will need DME orders for bed and wheelchair.

## 2019-07-29 NOTE — Progress Notes (Addendum)
°   07/29/19 1445  Clinical Encounter Type  Visited With Patient and family together  Visit Type Initial  Referral From Nurse  Consult/Referral To Chaplain  Chaplain responded to a page from the nurse. When she reached the nurse he told her that the patient's wife probably would benefit from a visit. When chaplain arrived, she met patient's wife. Mrs. Jurgens began explaining what is going on with her husband and how that was a big change in his condition from yesterday to today. Mrs. Swider was often teary eyed while talking. Her and her husband have been married for 63 years. Mrs. Armitage is second guessing not having home health care come to her home to take care of her husband. Chaplain asked if would have been able to take care of him in the home and she said no. She said she is emotional, spent and hasn't been eating. Mrs. Cadavid is currently waiting to hear about where and when her husband will be transferred to a facility. Chaplain asked if she could pray with them, and Mrs. Adan said yes. Chaplain prayer and told Mrs. Rhue that she would check back in on Mr. Sagraves.

## 2019-07-30 ENCOUNTER — Emergency Department: Payer: Medicare Other

## 2019-07-30 ENCOUNTER — Encounter: Payer: Self-pay | Admitting: *Deleted

## 2019-07-30 LAB — CBC WITH DIFFERENTIAL/PLATELET
Abs Immature Granulocytes: 0.03 10*3/uL (ref 0.00–0.07)
Basophils Absolute: 0.1 10*3/uL (ref 0.0–0.1)
Basophils Relative: 1 %
Eosinophils Absolute: 0.1 10*3/uL (ref 0.0–0.5)
Eosinophils Relative: 1 %
HCT: 51.3 % (ref 39.0–52.0)
Hemoglobin: 17.9 g/dL — ABNORMAL HIGH (ref 13.0–17.0)
Immature Granulocytes: 0 %
Lymphocytes Relative: 15 %
Lymphs Abs: 1.3 10*3/uL (ref 0.7–4.0)
MCH: 30.3 pg (ref 26.0–34.0)
MCHC: 34.9 g/dL (ref 30.0–36.0)
MCV: 86.9 fL (ref 80.0–100.0)
Monocytes Absolute: 0.7 10*3/uL (ref 0.1–1.0)
Monocytes Relative: 8 %
Neutro Abs: 6.4 10*3/uL (ref 1.7–7.7)
Neutrophils Relative %: 75 %
Platelets: 152 10*3/uL (ref 150–400)
RBC: 5.9 MIL/uL — ABNORMAL HIGH (ref 4.22–5.81)
RDW: 12.6 % (ref 11.5–15.5)
WBC: 8.5 10*3/uL (ref 4.0–10.5)
nRBC: 0 % (ref 0.0–0.2)

## 2019-07-30 LAB — BASIC METABOLIC PANEL
Anion gap: 11 (ref 5–15)
BUN: 25 mg/dL — ABNORMAL HIGH (ref 8–23)
CO2: 23 mmol/L (ref 22–32)
Calcium: 9.6 mg/dL (ref 8.9–10.3)
Chloride: 101 mmol/L (ref 98–111)
Creatinine, Ser: 1.3 mg/dL — ABNORMAL HIGH (ref 0.61–1.24)
GFR calc Af Amer: 58 mL/min — ABNORMAL LOW (ref >60)
GFR calc non Af Amer: 50 mL/min — ABNORMAL LOW (ref >60)
Glucose, Bld: 143 mg/dL — ABNORMAL HIGH (ref 70–99)
Potassium: 4.5 mmol/L (ref 3.5–5.1)
Sodium: 135 mmol/L (ref 135–145)

## 2019-07-30 MED ORDER — DIPHENHYDRAMINE HCL 50 MG/ML IJ SOLN
25.0000 mg | Freq: Once | INTRAMUSCULAR | Status: AC
  Administered 2019-07-30: 25 mg via INTRAVENOUS
  Filled 2019-07-30: qty 1

## 2019-07-30 MED ORDER — METHYLPREDNISOLONE SODIUM SUCC 40 MG IJ SOLR
40.0000 mg | Freq: Once | INTRAMUSCULAR | Status: AC
  Administered 2019-07-30: 40 mg via INTRAVENOUS
  Filled 2019-07-30: qty 1

## 2019-07-30 MED ORDER — SODIUM CHLORIDE 0.9 % IV BOLUS
500.0000 mL | Freq: Once | INTRAVENOUS | Status: AC
  Administered 2019-07-30: 500 mL via INTRAVENOUS

## 2019-07-30 NOTE — ED Notes (Signed)
Pt noted to have a red/fine/bumpy/blanching rash to entire back Dr Scotty Court notified and he assessed rash  Per Dr Scotty Court a blue chuck pad was wrapped around pt right leg to see if a rash occurrs on leg to justify a contact dermatitis for the rash to back

## 2019-07-30 NOTE — ED Notes (Signed)
Pt has poor communication @ this time. Words are incomprehensible. Pt unable to follow commands. Pt unable to drink w/o choking and coughing. Pt does attempt to get out of bed but is too weak to do so effectively. Unable to tell if pt is confused due to communication deficits.

## 2019-07-30 NOTE — ED Notes (Addendum)
Pt with dry brief, no urine output since straight cath earlier this shift. Pt continues to be unable to speak or answer questions. Pt unable to eat or drink. EDP notified. No orders at this time.

## 2019-07-30 NOTE — ED Notes (Signed)
Pt spouse returned back to pt bedside approx 30 min ago - spouse is able to get pt to eat several bites of oatmeal and bacon without signs of aspiration/coughing/choking

## 2019-07-30 NOTE — ED Notes (Signed)
Pt spouse back at bedside and fedd pt a few bites of munch and a small container of juice (one spoonful at a time because pt is unable to drink from cup or straw)

## 2019-07-30 NOTE — ED Notes (Signed)
Pt sleeping at times, at times sliding towards side of bed or putting legs over side of bed. Bed alarm in place. Pt repositioned, given verbal encouragement to stay in bed.

## 2019-07-30 NOTE — ED Notes (Signed)
Patient was given full bed bath by ED Tech.

## 2019-07-30 NOTE — ED Notes (Signed)
Family back at bedside.

## 2019-07-30 NOTE — TOC Initial Note (Signed)
Transition of Care [facility]) - Initial/Assessment Note    Patient Details  Name: Bruce [name] MRN: [id number] Date of Birth: March 06, 1932  Transition of Care [facility]) CM/SW Contact:    Larwance Rote, LCSW Phone Number: 07/30/2019, 9:50 AM  Clinical Narrative:  Patient, Mr. after Wiseman is an 86 year old Caucasian male who presented to [facility] emergency room on 07/26/2019 from [facility] and sent to ER for further workup of "rattling" in lungs and low blood pressure. Multiple falls recently. History of dementia, hyperlipidemia, hypertension. Patient is active with [facility]. Patient previously appropriate for ALF and was scheduled for discharge to [facility] ALF [address], Memory Care Unit, however, patient's conditions have changed.  Physical therapist (PT) recommended SNF; supervision/Assistance - 24 hour. Patient need assistance with all ADLs. COVID vaccine/s completed. Pt is incontinent.  Prior to hospitalization patient used a walker.   Plan: SNF and wife will consider hospice care        New FL2 will be completed.  Bed requests for SNF faxed out to medicare preferred nursing homes. Waiting for bed offers.  Family updated on 07/30/2019 0952  Expected Discharge Plan: Skilled Nursing Facility Barriers to Discharge: Continued Medical Work up, SNF Pending bed offer   Patient Goals and CMS Choice Patient states their goals for this hospitalization and ongoing recovery are:: Pt's wife is requesting skills nursing facility for this patient. CMS Medicare.gov Compare Post Acute Care list provided to:: Patient Represenative (must comment) (Pt's wife) Choice offered to / list presented to : Spouse  Expected Discharge Plan and Services Expected Discharge Plan: Skilled Nursing Facility In-house Referral: Clinical Social Work, Orthoptist, Hospice / Palliative Care     Living arrangements for the past 2 months: Single Family Home                                       Prior Living Arrangements/Services Living arrangements for the past 2 months: Single Family Home Lives with:: Spouse (Patient lived with is wife of 63 years.) Patient language and need for interpreter reviewed:: No Do you feel safe going back to the place where you live?: No   Pt w/dementia.  Wife is not able to take cae of this patient.  Needs assistance with all his ADL's  Need for Family Participation in Patient Care: No (Comment) Care giver support system in place?: Yes (comment) (Wife of 63 years) Current home services: DME (walker, wheelchair, [facility]) Criminal Activity/Legal Involvement Pertinent to Current Situation/Hospitalization: No - Comment as needed  Activities of Daily Living      Permission Sought/Granted Permission sought to share information with : Case Manager, Magazine features editor, Family Supports Permission granted to share information with : Yes, Verbal Permission Granted  Share Information with NAME: Bruce [name],  spouse [phone]  Permission granted to share info w AGENCY: [facility] nursing facility.  Permission granted to share info w Relationship: Bruce [name], Bruce [name] [phone]     Emotional Assessment Appearance:: Developmentally appropriate Attitude/Demeanor/Rapport: Unable to Assess Affect (typically observed): Unable to Assess Orientation: : Oriented to Self, Oriented to Place, Oriented to  Time, Oriented to Situation Alcohol / Substance Use: Not Applicable Psych Involvement: No (comment)  Admission diagnosis:  hypotension and congestion Patient Active Problem List   Diagnosis Date Noted  . Weakness   . Goals of care, counseling/discussion   . Palliative care by specialist   . Dementia without behavioral disturbance (  HCC)   . Subdural hematoma (HCC) 03/05/2017  . Essential hypertension, benign 06/09/2016  . Hyperlipidemia 06/09/2016  . Diabetes (HCC) 06/09/2016  . Carotid stenosis 06/09/2016   PCP:  [name],  MD Pharmacy:   [facility] [address], [place] - [address] [place] [address] Phone: [phone] Fax: [phone]  [facility] - [place], [place] - [address]enee [address] [place] [address] Phone: [phone] Fax: [phone]     Social Determinants of Health (SDOH) Interventions    Readmission Risk Interventions No flowsheet data found.

## 2019-07-30 NOTE — ED Notes (Signed)
Pt not responding to verbal commands, pt unable to eat meal.

## 2019-07-30 NOTE — ED Notes (Addendum)
Pt cleansed of dry urine on sheets. Brief is currently dry. Sheets changed, pt repositoined in bed with pillows and blankets for comfort. Attempted to provide pt with po fluids with head of bed in high fowler's position. Pt not able to consume fluids. Will place speech evaluation for swallowing. Head of bed elevated to 45 degrees. Warm blankets provided. Glasses placed on pt's face for assist with vision.

## 2019-07-30 NOTE — ED Notes (Signed)
Patient bed linen, gown and diaper has been changed. Patient was wiped down with new, clean and dry moisture barrier pads.

## 2019-07-30 NOTE — ED Notes (Signed)
Pt turned, dried and repositioned

## 2019-07-30 NOTE — NC FL2 (Signed)
[place] MEDICAID FL2 LEVEL OF CARE SCREENING TOOL     IDENTIFICATION  Patient Name: Bruce [name] Birthdate: [birth year] Sex: male Admission Date (Current Location): 07/26/2019  [facility] and [place] Number:  Chiropodist and Address:  [facility], [address], [place], [place] [address]      Provider Number: [id number]  Attending Physician Name and Address:  No att. providers found  Relative Name and Phone Number:  Cliffard Hair [phone]    Current Level of Care: Hospital Recommended Level of Care: Memory Care, Skilled Nursing Facility Prior Approval Number:    Date Approved/Denied:   PASRR Number: [id number]  Discharge Plan: Other (Comment) (Memory Care)    Current Diagnoses: Patient Active Problem List   Diagnosis Date Noted   Weakness    Goals of care, counseling/discussion    Palliative care by specialist    Dementia without behavioral disturbance (HCC)    Subdural hematoma (HCC) 03/05/2017   Essential hypertension, benign 06/09/2016   Hyperlipidemia 06/09/2016   Diabetes (HCC) 06/09/2016   Carotid stenosis 06/09/2016    Orientation RESPIRATION BLADDER Height & Weight     Self, Place, Situation  Normal Incontinent Weight: 130 lb (59 kg) Height:  5\' 7"  (170.2 cm)  BEHAVIORAL SYMPTOMS/MOOD NEUROLOGICAL BOWEL NUTRITION STATUS      Incontinent Diet  AMBULATORY STATUS COMMUNICATION OF NEEDS Skin   Extensive Assist Non-Verbally Normal                       Personal Care Assistance Level of Assistance  Bathing, Feeding, Dressing, Total care Bathing Assistance: Limited assistance Feeding assistance: Maximum assistance Dressing Assistance: Maximum assistance Total Care Assistance: Maximum assistance   Functional Limitations Info  Sight, Hearing, Speech Sight Info: Adequate Hearing Info: Adequate Speech Info: Impaired    SPECIAL CARE FACTORS FREQUENCY                       Contractures       Additional Factors Info  Code Status, Allergies   Allergies Info: No Known Allergies           Current Medications (07/30/2019):  This is the current hospital active medication list Current Facility-Administered Medications  Medication Dose Route Frequency Provider Last Rate Last Admin   amitriptyline (ELAVIL) tablet 25 mg  25 mg Oral QHS 08/01/2019, MD   25 mg at 07/28/19 2102   aspirin EC tablet 81 mg  81 mg Oral Daily 2103, MD   81 mg at 07/28/19 1328   calcium carbonate (TUMS - dosed in mg elemental calcium) chewable tablet 200 mg of elemental calcium  1 tablet Oral TID PRN 07/30/19 A, NP       docusate sodium (COLACE) capsule 100 mg  100 mg Oral BID Lillia Carmel, MD   100 mg at 07/28/19 2102   glimepiride (AMARYL) tablet 2 mg  2 mg Oral Q breakfast Dove, Tasha A, NP   2 mg at 07/28/19 1329   melatonin tablet 5 mg  5 mg Oral QHS Dove, Tasha A, NP       metFORMIN (GLUCOPHAGE) tablet 500 mg  500 mg Oral BID 07/30/19, MD   500 mg at 07/28/19 2102   pantoprazole (PROTONIX) EC tablet 40 mg  40 mg Oral Daily 2103, MD   40 mg at 07/28/19 1328   simvastatin (ZOCOR) tablet 20 mg  20 mg Oral Daily 07/30/19, MD  20 mg at 07/28/19 2102   traZODone (DESYREL) tablet 50 mg  50 mg Oral QHS Dove, Tasha A, NP   50 mg at 07/28/19 2102   Current Outpatient Medications  Medication Sig Dispense Refill   amitriptyline (ELAVIL) 25 MG tablet Take 25 mg by mouth at bedtime.     aspirin EC 81 MG tablet Take 81 mg by mouth daily. Swallow whole.     diphenhydrAMINE (BENADRYL) 25 mg capsule Take 1 capsule by mouth every 6 (six) hours as needed.     docusate sodium (COLACE) 100 MG capsule Take 1 capsule by mouth 2 (two) times daily.     doxazosin (CARDURA) 4 MG tablet Take 1 tablet by mouth daily.     glimepiride (AMARYL) 2 MG tablet Take 1 tablet by mouth daily.     levocetirizine (XYZAL) 5 MG tablet TAKE ONE TABLET EACH EVENING      lisinopril (PRINIVIL,ZESTRIL) 20 MG tablet Take 1 tablet by mouth daily.     melatonin 5 MG TABS Take 5 mg by mouth.     metFORMIN (GLUCOPHAGE) 500 MG tablet Take 1 tablet by mouth 2 (two) times daily.     Multiple Vitamin (MULTI-VITAMINS) TABS Take 1 tablet by mouth daily.     omeprazole (PRILOSEC) 20 MG capsule Take 1 capsule by mouth daily.     simvastatin (ZOCOR) 20 MG tablet Take 1 tablet by mouth daily.     tamsulosin (FLOMAX) 0.4 MG CAPS capsule Take 0.4 mg by mouth daily.     traZODone (DESYREL) 100 MG tablet SMARTSIG:1 Tablet(s) By Mouth     memantine (NAMENDA) 10 MG tablet Take 10 mg by mouth daily. (Patient not taking: Reported on 07/26/2019)     memantine (NAMENDA) 5 MG tablet Take 1 tablet by mouth 2 (two) times daily. (Patient not taking: Reported on 07/26/2019)       Discharge Medications: Please see discharge summary for a list of discharge medications.  Relevant Imaging Results:  Relevant Lab Results:   Additional Information SS#[id number]  Larwance Rote, LCSW

## 2019-07-30 NOTE — ED Provider Notes (Signed)
-----------------------------------------   7:53 AM on 07/30/2019 -----------------------------------------  I took over care from the previous physician.  During the night shift, the patient was noted to have some difficulty swallowing and an MRI/MRA was ordered to evaluate for possible acute stroke not seen on the negative CT head obtained yesterday evening.  The studies are negative for acute infarct or intracranial large vessel occlusion.  At this time, there is no indication for inpatient admission.  Disposition remains SNF placement.   Dionne Bucy, MD 07/30/19 [phone]

## 2019-07-30 NOTE — ED Provider Notes (Signed)
Procedures     ----------------------------------------- 8:21 PM on 07/30/2019 ----------------------------------------- Had a discussion regarding code status with the patient wife, Damen Windsor, his HCPOA who confirms that patient's wishes are to be DNR, and no feeding tubes.  She reports she was able to feed him breakfast and lunch today, but that he needs assistance with eating due to his dementia.     Sharman Cheek, MD 07/30/19 2023

## 2019-07-30 NOTE — ED Notes (Signed)
F/U to rash on back. Pt's rash covers back from hips to shoulders and is migrating to anterior torso.

## 2019-07-30 NOTE — ED Notes (Signed)
Advised pt family to not pour liquids or force liquids/food into patient mouth - this nurse noted that they were forcing liquid and food and causing the patient to cough/choke

## 2019-07-30 NOTE — ED Notes (Addendum)
Family at bedside - Pt refusing to drink or eat for this nurse or family - Pt is unable to follow simple commands of swallow/drink/eat  Pt has congested cough with rales noted in bilat lower lobes - rales decrease when pt is able to cough to clear - provider notified

## 2019-07-30 NOTE — ED Notes (Signed)
Pt turned, dried, and repositioned 

## 2019-07-31 ENCOUNTER — Other Ambulatory Visit: Payer: Self-pay

## 2019-07-31 ENCOUNTER — Encounter: Payer: Self-pay | Admitting: *Deleted

## 2019-07-31 DIAGNOSIS — N1831 Chronic kidney disease, stage 3a: Secondary | ICD-10-CM | POA: Diagnosis not present

## 2019-07-31 DIAGNOSIS — R079 Chest pain, unspecified: Secondary | ICD-10-CM | POA: Diagnosis present

## 2019-07-31 DIAGNOSIS — F329 Major depressive disorder, single episode, unspecified: Secondary | ICD-10-CM | POA: Diagnosis present

## 2019-07-31 DIAGNOSIS — R778 Other specified abnormalities of plasma proteins: Secondary | ICD-10-CM | POA: Diagnosis present

## 2019-07-31 DIAGNOSIS — E785 Hyperlipidemia, unspecified: Secondary | ICD-10-CM

## 2019-07-31 DIAGNOSIS — R21 Rash and other nonspecific skin eruption: Secondary | ICD-10-CM

## 2019-07-31 DIAGNOSIS — K219 Gastro-esophageal reflux disease without esophagitis: Secondary | ICD-10-CM

## 2019-07-31 DIAGNOSIS — W19XXXA Unspecified fall, initial encounter: Secondary | ICD-10-CM | POA: Diagnosis not present

## 2019-07-31 DIAGNOSIS — I10 Essential (primary) hypertension: Secondary | ICD-10-CM | POA: Diagnosis present

## 2019-07-31 DIAGNOSIS — N183 Chronic kidney disease, stage 3 unspecified: Secondary | ICD-10-CM | POA: Diagnosis present

## 2019-07-31 DIAGNOSIS — F32A Depression, unspecified: Secondary | ICD-10-CM | POA: Diagnosis present

## 2019-07-31 LAB — GLUCOSE, CAPILLARY
Glucose-Capillary: 202 mg/dL — ABNORMAL HIGH (ref 70–99)
Glucose-Capillary: 237 mg/dL — ABNORMAL HIGH (ref 70–99)
Glucose-Capillary: 176 mg/dL — ABNORMAL HIGH (ref 70–99)

## 2019-07-31 LAB — TROPONIN I (HIGH SENSITIVITY)
Troponin I (High Sensitivity): 36 ng/L — ABNORMAL HIGH (ref <18)
Troponin I (High Sensitivity): 39 ng/L — ABNORMAL HIGH (ref <18)
Troponin I (High Sensitivity): 33 ng/L — ABNORMAL HIGH (ref <18)
Troponin I (High Sensitivity): 32 ng/L — ABNORMAL HIGH (ref <18)

## 2019-07-31 MED ORDER — HYDRALAZINE HCL 20 MG/ML IJ SOLN: 5.0000 mg | INTRAMUSCULAR | Status: DC | PRN

## 2019-07-31 MED ORDER — ACETAMINOPHEN 325 MG PO TABS: 650.0000 mg | ORAL_TABLET | Freq: Four times a day (QID) | ORAL | Status: DC | PRN

## 2019-07-31 MED ORDER — PNEUMOCOCCAL VAC POLYVALENT 25 MCG/0.5ML IJ INJ
0.5000 mL | INJECTION | INTRAMUSCULAR | Status: AC
  Administered 2019-08-01: 0.5 mL via INTRAMUSCULAR
  Filled 2019-07-31: qty 0.5

## 2019-07-31 MED ORDER — DIPHENHYDRAMINE HCL 50 MG/ML IJ SOLN: 25.0000 mg | Freq: Four times a day (QID) | INTRAMUSCULAR | Status: DC | PRN

## 2019-07-31 MED ORDER — METHYLPREDNISOLONE SODIUM SUCC 40 MG IJ SOLR
40.0000 mg | Freq: Every day | INTRAMUSCULAR | Status: DC
  Administered 2019-08-01 – 2019-08-02 (×2): 40 mg via INTRAVENOUS
  Filled 2019-07-31 (×2): qty 1

## 2019-07-31 MED ORDER — LISINOPRIL 20 MG PO TABS
20.0000 mg | ORAL_TABLET | Freq: Every day | ORAL | Status: DC
  Administered 2019-07-31 – 2019-08-02 (×3): 20 mg via ORAL
  Filled 2019-07-31 (×3): qty 1

## 2019-07-31 MED ORDER — METHYLPREDNISOLONE SODIUM SUCC 125 MG IJ SOLR
60.0000 mg | Freq: Every day | INTRAMUSCULAR | Status: DC
  Administered 2019-07-31: 60 mg via INTRAVENOUS
  Filled 2019-07-31: qty 2

## 2019-07-31 MED ORDER — INSULIN ASPART 100 UNIT/ML ~~LOC~~ SOLN: 0.0000 [IU] | Freq: Every day | SUBCUTANEOUS | Status: DC

## 2019-07-31 MED ORDER — INSULIN ASPART 100 UNIT/ML ~~LOC~~ SOLN
0.0000 [IU] | Freq: Three times a day (TID) | SUBCUTANEOUS | Status: DC
  Administered 2019-07-31 – 2019-08-01 (×2): 3 [IU] via SUBCUTANEOUS
  Administered 2019-08-01: 2 [IU] via SUBCUTANEOUS
  Administered 2019-08-01: 1 [IU] via SUBCUTANEOUS
  Administered 2019-08-02: 2 [IU] via SUBCUTANEOUS
  Filled 2019-07-31 (×5): qty 1

## 2019-07-31 MED ORDER — MORPHINE SULFATE (PF) 2 MG/ML IV SOLN: 0.5000 mg | INTRAVENOUS | Status: DC | PRN

## 2019-07-31 MED ORDER — NITROGLYCERIN 0.4 MG SL SUBL: 0.4000 mg | SUBLINGUAL_TABLET | SUBLINGUAL | Status: DC | PRN

## 2019-07-31 MED ORDER — ONDANSETRON HCL 4 MG/2ML IJ SOLN: 4.0000 mg | Freq: Three times a day (TID) | INTRAMUSCULAR | Status: DC | PRN

## 2019-07-31 MED ORDER — DIPHENHYDRAMINE HCL 50 MG/ML IJ SOLN
12.5000 mg | Freq: Four times a day (QID) | INTRAMUSCULAR | Status: DC | PRN
  Administered 2019-07-31 – 2019-08-03 (×2): 12.5 mg via INTRAVENOUS
  Filled 2019-07-31 (×2): qty 1

## 2019-07-31 MED ORDER — ADULT MULTIVITAMIN W/MINERALS CH
1.0000 | ORAL_TABLET | Freq: Every day | ORAL | Status: DC
  Administered 2019-07-31 – 2019-08-02 (×3): 1 via ORAL
  Filled 2019-07-31 (×3): qty 1

## 2019-07-31 NOTE — ED Notes (Signed)
HgbA1c ordered added on to previous sent labs.

## 2019-07-31 NOTE — ED Notes (Signed)
DNR armband placed. Fall armband place.

## 2019-07-31 NOTE — H&P (Addendum)
History and Physical    Bruce [name]:[id number] DOB: 26-Jun-1932 DOA: 07/26/2019  Referring MD/NP/PA:   PCP: [name], MD   Patient coming from:  The patient is coming from home.  At baseline, pt is dependent for most of ADL.        Chief Complaint: fall, chest pain and rash in back  HPI: Bruce [name] is a 86 y.o. male with medical history significant of hypertension, hyperlipidemia, diabetes mellitus, GERD, depression, eczema, SDH, carotid artery stenosis (endarterectomy), CKD-3, who presents with fall, chest pain and rash on back.  Patient has been in ED in the past 5 days.  Per his wife, initially patient presented to the ED due to generalized weakness and fell twice at home.  Patient had negative images including CT head and CT of C-spine for acute issues.  MRI of brain is negative for stroke.  MRA is negative for LVO.  Per EDP, pt later on developed some chest pain. pt cannot provide detailed information to further characterize his chest pain, it seems to be substernal area, not seems to be severe.  Per his wife, patient has dry cough and mild shortness breath, no fever or chills.  No nausea, vomiting, diarrhea or abdominal pain.  He moves all extremities.  No facial droop or slurred speech  Patient was found to have diffuse erythematous rash in his back. It seems to be itchy since patient is attempting to scratch at the rash constantly. Pt his wife, pt is allergic to beef, each time he eats beef he develops similar rash.   ED Course: pt was found to have troponin level 13, 9, 36, BNP 226, negative urinalysis, Covid PCR negative on 07/29/2019, stable renal function, temperature normal, blood pressure 160/64, tachycardia, RR 21, oxygen saturation 93 to 96% on room air.  Chest x-ray negative on 7/17.  Patient is placed on progressive bed for observation.  Review of Systems: Could not reviewed accurately due to dementia   Allergy:  Allergies  Allergen Reactions  .  Beef-Derived Products Rash    Past Medical History:  Diagnosis Date  . Alzheimer's dementia (HCC)   . Hyperlipidemia   . Hypertension     Past Surgical History:  Procedure Laterality Date  . Carotid artery endarterectomy    . Intracranial bleeding surgery      Social History:  reports that he has never smoked. He has never used smokeless tobacco. He reports current alcohol use. He reports that he does not use drugs.  Family History:  Family History  Problem Relation Age of Onset  . Diabetes Mellitus II Brother   . Stroke Brother      Prior to Admission medications   Medication Sig Start Date End Date Taking? Authorizing Provider  amitriptyline (ELAVIL) 25 MG tablet Take 25 mg by mouth at bedtime.   Yes [provider]  aspirin EC 81 MG tablet Take 81 mg by mouth daily. Swallow whole.   Yes [provider]  diphenhydrAMINE (BENADRYL) 25 mg capsule Take 1 capsule by mouth every 6 (six) hours as needed.   Yes [provider]  docusate sodium (COLACE) 100 MG capsule Take 1 capsule by mouth 2 (two) times daily.   Yes [provider]  doxazosin (CARDURA) 4 MG tablet Take 1 tablet by mouth daily. 05/24/15  Yes [provider]  glimepiride (AMARYL) 2 MG tablet Take 1 tablet by mouth daily. 01/14/16  Yes [provider]  levocetirizine (XYZAL) 5 MG tablet  TAKE ONE TABLET EACH EVENING 06/19/19  Yes [provider]  lisinopril (PRINIVIL,ZESTRIL) 20 MG tablet Take 1 tablet by mouth daily. 11/20/15  Yes [provider]  melatonin 5 MG TABS Take 5 mg by mouth.   Yes [provider]  metFORMIN (GLUCOPHAGE) 500 MG tablet Take 1 tablet by mouth 2 (two) times daily. 11/19/15  Yes [provider]  Multiple Vitamin (MULTI-VITAMINS) TABS Take 1 tablet by mouth daily.   Yes [provider]  omeprazole (PRILOSEC) 20 MG capsule Take 1 capsule by mouth daily.   Yes [provider]  simvastatin (ZOCOR)  20 MG tablet Take 1 tablet by mouth daily. 12/11/15  Yes [provider]  tamsulosin (FLOMAX) 0.4 MG CAPS capsule Take 0.4 mg by mouth daily. 06/19/19  Yes [provider]  traZODone (DESYREL) 100 MG tablet SMARTSIG:1 Tablet(s) By Mouth 06/27/19  Yes [provider]  memantine (NAMENDA) 10 MG tablet Take 10 mg by mouth daily. Patient not taking: Reported on 07/26/2019 03/30/19   [provider]  memantine (NAMENDA) 5 MG tablet Take 1 tablet by mouth 2 (two) times daily. Patient not taking: Reported on 07/26/2019 01/14/16   [provider]    Physical Exam: Vitals:   07/31/19 1000 07/31/19 1030 07/31/19 1100 07/31/19 1330  BP: (!) 149/71 (!) 155/69 (!) 160/64 (!) 149/72  Pulse: 70 66 61 65  Resp:  (!) 21 10 14   Temp:      TempSrc:      SpO2: 98% 99% 96% 99%  Weight:      Height:       General: Not in acute distress HEENT:       Eyes: PERRL, EOMI, no scleral icterus.       ENT: No discharge from the ears and nose, no pharynx injection, no tonsillar enlargement.        Neck: No JVD, no bruit, no mass felt. Heme: No neck lymph node enlargement. Cardiac: S1/S2, RRR, No murmurs, No gallops or rubs. Respiratory: Has rhonchorous bilaterally  GI: Soft, nondistended, nontender, no organomegaly, BS present. GU: No hematuria Ext: No pitting leg edema bilaterally. 2+DP/PT pulse bilaterally. Musculoskeletal: No joint deformities, No joint redness or warmth, no limitation of ROM in spin. Skin: Has erythematous rashes in back Neuro: Alert, knows his own name, but is not orientated to the place and time. cranial nerves II-XII grossly intact, moves all extremities  Psych: Patient is not psychotic, no suicidal or hemocidal ideation.  Labs on Admission: I have personally reviewed following labs and imaging studies  CBC: Recent Labs  Lab 07/26/19 1119 07/29/19 0935 07/30/19 2135  WBC 6.0 6.2 8.5  NEUTROABS  --  3.5 6.4  HGB 14.7 16.4 17.9*  HCT 42.5  47.5 51.3  MCV 87.8 87.6 86.9  PLT 142* 147* 152   Basic Metabolic Panel: Recent Labs  Lab 07/26/19 1119 07/29/19 0935 07/30/19 2135  NA 130* 132* 135  K 4.7 4.1 4.5  CL 94* 96* 101  CO2 23 23 23   GLUCOSE 132* 136* 143*  BUN 20 20 25*  CREATININE 1.36* 1.16 1.30*  CALCIUM 9.3 9.3 9.6   GFR: Estimated Creatinine Clearance: 35.3 mL/min (A) (by C-G formula based on SCr of 1.3 mg/dL (H)). Liver Function Tests: Recent Labs  Lab 07/29/19 0935  AST 34  ALT 25  ALKPHOS 54  BILITOT 0.8  PROT 7.0  ALBUMIN 4.2   No results for input(s): LIPASE, AMYLASE in the last 168 hours. No results for  input(s): AMMONIA in the last 168 hours. Coagulation Profile: No results for input(s): INR, PROTIME in the last 168 hours. Cardiac Enzymes: No results for input(s): CKTOTAL, CKMB, CKMBINDEX, TROPONINI in the last 168 hours. BNP (last 3 results) No results for input(s): PROBNP in the last 8760 hours. HbA1C: No results for input(s): HGBA1C in the last 72 hours. CBG: Recent Labs  Lab 07/26/19 1120 07/29/19 0750  GLUCAP 120* 121*   Lipid Profile: No results for input(s): CHOL, HDL, LDLCALC, TRIG, CHOLHDL, LDLDIRECT in the last 72 hours. Thyroid Function Tests: No results for input(s): TSH, T4TOTAL, FREET4, T3FREE, THYROIDAB in the last 72 hours. Anemia Panel: No results for input(s): VITAMINB12, FOLATE, FERRITIN, TIBC, IRON, RETICCTPCT in the last 72 hours. Urine analysis:    Component Value Date/Time   COLORURINE YELLOW (A) 07/29/2019 1826   APPEARANCEUR CLEAR (A) 07/29/2019 1826   LABSPEC 1.021 07/29/2019 1826   PHURINE 5.0 07/29/2019 1826   GLUCOSEU NEGATIVE 07/29/2019 1826   HGBUR SMALL (A) 07/29/2019 1826   BILIRUBINUR NEGATIVE 07/29/2019 1826   KETONESUR 5 (A) 07/29/2019 1826   PROTEINUR 30 (A) 07/29/2019 1826   NITRITE NEGATIVE 07/29/2019 1826   LEUKOCYTESUR NEGATIVE 07/29/2019 1826   Sepsis Labs: @LABRCNTIP (procalcitonin:4,lacticidven:4) ) Recent Results (from the  past 240 hour(s))  SARS Coronavirus 2 by RT PCR (hospital order, performed in [facility] hospital lab) Nasopharyngeal Nasopharyngeal Swab     Status: None   Collection Time: 07/29/19 12:11 PM   Specimen: Nasopharyngeal Swab  Result Value Ref Range Status   SARS Coronavirus 2 NEGATIVE NEGATIVE Final    Comment: (NOTE) SARS-CoV-2 target nucleic acids are NOT DETECTED.  The SARS-CoV-2 RNA is generally detectable in upper and lower respiratory specimens during the acute phase of infection. The lowest concentration of SARS-CoV-2 viral copies this assay can detect is 250 copies / mL. A negative result does not preclude SARS-CoV-2 infection and should not be used as the sole basis for treatment or other patient management decisions.  A negative result may occur with improper specimen collection / handling, submission of specimen other than nasopharyngeal swab, presence of viral mutation(s) within the areas targeted by this assay, and inadequate number of viral copies (<250 copies / mL). A negative result must be combined with clinical observations, patient history, and epidemiological information.  Fact Sheet for Patients:   07/31/19  Fact Sheet for Healthcare Providers: BoilerBrush.com.cy  This test is not yet approved or  cleared by the https://pope.com/ FDA and has been authorized for detection and/or diagnosis of SARS-CoV-2 by FDA under an Emergency Use Authorization (EUA).  This EUA will remain in effect (meaning this test can be used) for the duration of the COVID-19 declaration under Section 564(b)(1) of the Act, 21 U.S.C. section 360bbb-3(b)(1), unless the authorization is terminated or revoked sooner.  Performed at [facility], [address], [place], [place]      Radiological Exams on Admission: MR ANGIO HEAD WO CONTRAST  Result Date: 07/30/2019 CLINICAL DATA:  Ataxia.  Rule out stroke.  Dementia.  EXAM: MRI HEAD WITHOUT CONTRAST MRA HEAD WITHOUT CONTRAST TECHNIQUE: Multiplanar, multiecho pulse sequences of the brain and surrounding structures were obtained without intravenous contrast. Angiographic images of the head were obtained using MRA technique without contrast. COMPARISON:  None. FINDINGS: MRI HEAD FINDINGS Brain: Negative for acute infarct Moderate atrophy. Moderate chronic microvascular ischemic changes in the white matter. 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 Brainstem and cerebellum intact. Negative for hemorrhage or mass. Vascular:  Normal arterial flow voids. Skull and upper cervical spine: No focal skeletal lesion. Sinuses/Orbits: Retention cysts in the maxillary sinus bilaterally. Bilateral cataract extraction Other: None MRA HEAD FINDINGS Both vertebral arteries patent to the basilar without significant stenosis. Left PICA patent. Right PICA patent. Prominent right AICA patent. Posterior cerebral arteries patent bilaterally without stenosis. Stenosis at the skull base of the right internal carotid artery likely is artifact. Left internal carotid artery patent. Cavernous carotid patent bilaterally. Mild to moderate stenosis proximal left A2. Right posterior consent right anterior cerebral artery patent. Atherosclerotic irregularity in the middle cerebral artery branches bilaterally. M1 segments patent bilaterally. IMPRESSION: 1. Negative for acute infarct. Moderate atrophy and moderate chronic ischemic change 2. MRA negative for intracranial large vessel occlusion. There is moderate atherosclerotic disease in the middle cerebral artery branches bilaterally. Mild to moderate stenosis left anterior cerebral artery. Electronically Signed   By: Marlan Palau M.D.   On: 07/30/2019 07:24   MR BRAIN WO CONTRAST  Result Date: 07/30/2019 CLINICAL DATA:  Ataxia.  Rule out stroke.  Dementia. EXAM: MRI HEAD WITHOUT CONTRAST MRA HEAD WITHOUT CONTRAST TECHNIQUE: Multiplanar, multiecho pulse  sequences of the brain and surrounding structures were obtained without intravenous contrast. Angiographic images of the head were obtained using MRA technique without contrast. COMPARISON:  None. FINDINGS: MRI HEAD FINDINGS Brain: Negative for acute infarct Moderate atrophy. Moderate chronic microvascular ischemic changes in the white matter. 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 Brainstem and cerebellum intact. Negative for hemorrhage or mass. Vascular: Normal arterial flow voids. Skull and upper cervical spine: No focal skeletal lesion. Sinuses/Orbits: Retention cysts in the maxillary sinus bilaterally. Bilateral cataract extraction Other: None MRA HEAD FINDINGS Both vertebral arteries patent to the basilar without significant stenosis. Left PICA patent. Right PICA patent. Prominent right AICA patent. Posterior cerebral arteries patent bilaterally without stenosis. Stenosis at the skull base of the right internal carotid artery likely is artifact. Left internal carotid artery patent. Cavernous carotid patent bilaterally. Mild to moderate stenosis proximal left A2. Right posterior consent right anterior cerebral artery patent. Atherosclerotic irregularity in the middle cerebral artery branches bilaterally. M1 segments patent bilaterally. IMPRESSION: 1. Negative for acute infarct. Moderate atrophy and moderate chronic ischemic change 2. MRA negative for intracranial large vessel occlusion. There is moderate atherosclerotic disease in the middle cerebral artery branches bilaterally. Mild to moderate stenosis left anterior cerebral artery. Electronically Signed   By: Marlan Palau M.D.   On: 07/30/2019 07:24   DG Chest Portable 1 View  Result Date: 07/29/2019 CLINICAL DATA:  Weakness.  History of dementia. EXAM: PORTABLE CHEST 1 VIEW COMPARISON:  07/28/2019 FINDINGS: Normal sized heart. Tortuous aorta. Clear lungs with normal vascularity. Lower thoracic spine degenerative changes. IMPRESSION: No  acute abnormality. Electronically Signed   By: Beckie Salts M.D.   On: 07/29/2019 15:59     EKG: Independently reviewed.  Sinus rhythm, QTC 458, T wave inversion in precordial leads which is not new, LAD, right bundle blockage  Assessment/Plan Principal Problem:   Chest pain Active Problems:   Hyperlipidemia   Type II diabetes mellitus with renal manifestations (HCC)   Dementia without behavioral disturbance (HCC)   Fall   HTN (hypertension)   Rash   CKD (chronic kidney disease), stage IIIa   Elevated troponin   GERD (gastroesophageal reflux disease)   Depression   Chest pain and elevated trop: trop 13 -->9 -->36.  EKG has old T wave inversion in precordial leads.  Patient cannot provide detailed information about  his chest pain.  Dr. Gwen PoundsKowalski of cardiology is consulted  - place to progressive unit for observation - Trend Trop - Repeat EKG in the am  - prn Nitroglycerin, Morphine, and aspirin, zocor - Risk factor stratification: will check FLP and A1C   Hyperlipidemia -zocor  Type II diabetes mellitus with renal manifestations [facility](HCC): Recent A1c 6.9, well controlled.  Patient is taking Metformin and Amaryl at home -SSI  Dementia without behavioral disturbance (HCC) -Patient used to be on Namenda which was discontinued by neurologist  Fall -PT/OT  HTN (hypertension) -As needed hydralazine -Lisinopril  Rash -Patient is on Solu-Medrol and Benadryl  CKD (chronic kidney disease), stage IIIa: Stable. -Follow-up of BMP  GERD (gastroesophageal reflux disease) -Protonix  Depression -Continue home medications: Amitriptyline   Disposition: Patient has been in ED for 5 days. EDP has been trying for placement, unfortunately there is very limited placement options currently. Pt now has chest pain with elevated troponin.  It is not appropriate to discharge home. Will have PT/OT evaluation and consult transition care team for possible nursing home placement.    DVT ppx:  SQ Lovenox Code Status: DNR per his wife Family Communication: Yes, patient's wife and sister-in law at bed side Disposition Plan:  Anticipate discharge to SNF Consults called:  Dr. Gwen PoundsKowalski of card Admission status:   progressive unit for obs      Status is: Observation  The patient remains OBS appropriate and will d/c before 2 midnights.  Dispo: The patient is from: Home              Anticipated d/c is to: SNF              Anticipated d/c date is: 1 day              Patient currently is not medically stable to d/c.          Date of Service 07/31/2019    Lorretta HarpXilin Zarayah Lanting [facility]   If 7PM-7AM, please contact night-coverage [email or web address] 07/31/2019, 2:14 PM

## 2019-07-31 NOTE — Progress Notes (Signed)
OT Cancellation Note  Patient Details Name: Bruce [name] MRN: [id number] DOB: [birth year]   Cancelled Treatment:    Reason Eval/Treat Not Completed: Medical issues which prohibited therapy. Consult received, chart reviewed. Per chart, pt with chest pain, elevated troponin. Cardiology consult pending. Will hold OT evaluation and re-attempt at later date/time as pt is medically appropriate and pending updated plan of care.   Richrd Prime, MPH, MS, OTR/L ascom [phone] 07/31/19, 2:37 PM

## 2019-07-31 NOTE — ED Provider Notes (Signed)
-----------------------------------------   5:49 AM on 07/31/2019 -----------------------------------------  Patient noted to have worsening red raised rash to his back, previously treated with one-time dose of Benadryl and Solu-Medrol.  He is attempting to scratch at the rash constantly and developing excoriations.  Rash does not appear infectious in nature and is blanchable, not consistent with petechia.  I suspect a contact dermatitis and we will start patient on scheduled Solu-Medrol and IV Benadryl as needed given he has been unable to tolerate p.o.       Chesley Noon, MD 07/31/19 [phone]

## 2019-07-31 NOTE — ED Notes (Signed)
Per Tammy Sours, patient ok to have 2 visitors daily for duration of stay.

## 2019-07-31 NOTE — TOC Progression Note (Signed)
Transition of Care [facility]) - Progression Note    Patient Details  Name: Bruce [name] MRN: [id number] Date of Birth: 07-20-1932  [facility]) CM/SW Contact  Bruce [name] Phone Number: [phone] 07/31/2019, 1:49 PM  Clinical Narrative:     CSW spoke with patient's wife Bruce [name] [phone] (cell phone) at length about SNF placement possibilities. This CSW explained to Bruce [name] the process of SNF placement from the ED and the difficulty of placing the patient due to the Medicare three night rule.  This CSW also spoke with Bruce [name] about other options including hospice care, home health and private 24 hour assistance.    Expected Discharge Plan: Skilled Nursing Facility Barriers to Discharge: Continued Medical Work up, SNF Pending bed offer  Expected Discharge Plan and Services Expected Discharge Plan: Skilled Nursing Facility In-house Referral: Clinical Social Work, Orthoptist, Hospice / Palliative Care     Living arrangements for the past 2 months: Single Family Home                                       Social Determinants of Health (SDOH) Interventions    Readmission Risk Interventions No flowsheet data found.

## 2019-07-31 NOTE — ED Provider Notes (Addendum)
Patient has been evaluate by social work this morning unfortunately very limited placement options but not appropriate for discharge back home.  Has been in the ER for 120 hours and we have exhausted all options for placement.  Was notified by nurse patient was complaining of some midsternal nonradiating chest discomfort.  Repeat EKG is unchanged from previous.  Will add on troponins.  Still awaiting placement.  Is currently pain-free.   Willy Eddy, MD 07/31/19 1045  Patient's troponin is significantly elevated as compared to previous.  He denies any pain at this moment mildly hypertensive.  Will discuss with hospitalist for admission for chest pain work-up.    Willy Eddy, MD 07/31/19 1229

## 2019-07-31 NOTE — Evaluation (Addendum)
Clinical/Bedside Swallow Evaluation Patient Details  Name: Bruce [name] MRN: [id number] Date of Birth: 02-Sep-1932  Today's Date: 07/31/2019 Time: SLP Start Time (ACUTE ONLY): 1210 SLP Stop Time (ACUTE ONLY): 1300 SLP Time Calculation (min) (ACUTE ONLY): 50 min  Past Medical History:  Past Medical History:  Diagnosis Date   Alzheimer's dementia (HCC)    Hyperlipidemia    Hypertension    Past Surgical History:  Past Surgical History:  Procedure Laterality Date   Carotid artery endarterectomy     Intracranial bleeding surgery     HPI:  Pt is a 86 y.o. male with past medical history of advancing Dementia, HTN/HLD, subdural hematoma status post fall, carotid stenosis, diabetes, admitted on 07/26/2019 with unsteady gait. Pt has had a recent placement in memory care unit. 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 Wife reports increased stress and weight loss also herself d/t the increased care needs of pt. Per MD note at admit, pt is at his Baseline w/ his Cognitive status, Dementia.  MRI: Negative for acute infarct. Moderate atrophy and moderate chronic ischemic changes. CXR on 07/29/2019: No acute abnormality..    Assessment / Plan / Recommendation Clinical Impression  Pt presents w/ oropharyngeal phase dysphagia w/ po trials given suspect impacted by pt's Baseline Dementia/Cognitive decline; pt is at increased risk for aspiration. When following general aspiration precautions, he appeared to adequately tolerate trials of Nectar liquids and purees given cues/support. Pt presented w/ a slight open-mouth posture at rest, fidgity in bed, and easily distracted. He required much one on one attention to direct his awareness/attention to the po tasks. Slight wet respiratory sounds noted --  suspect on his own saliva pooled pharyngeally-laryngeally. He could not follow commands to cough/clear.  During the oral phase, he exhibited adequate control for bolus management, A-P transfer, and oral clearing when given time and encouragement. A-P transfer time was min increased when pt was distracted. Overall decreased awareness w/ follow through of tasks was noted. During the swallow, a delayed pharyngeal swallow initiation w/ thin liquids was suspect -- min less coordination of bolus and hard, multiple swallows occurred. Again, decreased awareness during task w/ min impulsivity noted. With trials of Nectar liquids (increased viscosity), pt appeared to exhibit a more timely and complete pharyngeal swallow w/ no overt s/s of aspiration noted w/ Nectar and puree trials; clear vocal quality and no decline in respiratory status noted w/ trials. Better coordinated swallows. Pt fed self w/ Mod-Max support d/t his Cognitive decline. OM exam(limited) appeared grossly wfl during bolus management; no unilateral weakness noted. Pt w/ native dentition.  Recommend a Puree consistency diet w/ Nectar liquids; general aspiration precautions; Pills Crushed in Puree for easier, safer swallowing d/t declined Cognitive status baseline. Recommend Supervision and assistance at meals; reducing distractions during meals and monitoring for follow through w/ eating/drinking/swallowing secondary to the declined Cognitive status/Dementia. ST services will monitor pt's status while admitted for appropriateness to upgrade diet consistency But Suspect this diet consistency would be safest for pt now w/ his presentation. NSG updated, agreed. Pt/family agreed.  SLP Visit Diagnosis: Dysphagia, oropharyngeal phase (R13.12) (Dementia/Cognitive decline baseline)    Aspiration Risk  Mild aspiration risk;Moderate aspiration risk;Risk for inadequate nutrition/hydration    Diet Recommendation  Dysphagia level 1 (puree) w/ NECTAR liquids;  general aspiration precautions; support at meals w/ feeding.  Reduce distractions at all meals.  Medication Administration: Crushed with puree (for safer swallowing)    Other  Recommendations Recommended Consults:  (Dietician f/u; Palliative and/or Hospice services) Oral Care Recommendations: Oral care BID;Oral care before and after PO;Staff/trained caregiver to provide oral care Other Recommendations: Order thickener from pharmacy;Prohibited food (jello, ice cream, thin soups);Remove water pitcher;Have oral suction available   Follow up Recommendations  (TBD)      Frequency and Duration min 1 x/week  1 week       Prognosis Prognosis for Safe Diet Advancement: Guarded Barriers to Reach Goals: Cognitive deficits;Time post onset;Severity of deficits;Behavior      Swallow Study   General Date of Onset: 07/25/19 HPI: Pt is a 86 y.o. male with past medical history of advancing Dementia, HTN/HLD, subdural hematoma status post fall, carotid stenosis, diabetes, admitted on 07/26/2019 with unsteady gait. Pt has had a recent placement in memory care unit. 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 Wife reports increased stress and weight loss also herself d/t the increased care needs of pt. Per MD note at admit, pt is at his Baseline w/ his Cognitive status, Dementia.  MRI: Negative for acute infarct. Moderate atrophy and moderate chronic ischemic changes. CXR on 07/29/2019: No acute abnormality..  Type of Study: Bedside Swallow Evaluation Previous Swallow Assessment: 02/2017 - mech soft diet then Diet Prior to this Study: Dysphagia 3 (soft);Thin liquids (2019) Temperature Spikes Noted: No (wbc 8.5) Respiratory Status: Room air History of Recent Intubation: No Behavior/Cognition:  Alert;Cooperative;Pleasant mood;Confused;Distractible;Requires cueing;Doesn't follow directions Oral Cavity Assessment: Within Functional Limits (w/ what he followed) Oral Care Completed by SLP: Recent completion by staff Oral Cavity - Dentition: Adequate natural dentition Vision: Functional for self-feeding Self-Feeding Abilities: Able to feed self;Total assist Patient Positioning: Upright in bed (needed positioning) Baseline Vocal Quality: Low vocal intensity (muttered speech) Volitional Cough: Cognitively unable to elicit Volitional Swallow: Unable to elicit    Oral/Motor/Sensory Function Overall Oral Motor/Sensory Function: Within functional limits (appeared grossly wfl; open mouth posture)   Ice Chips Ice chips: Not tested   Thin Liquid Thin Liquid: Impaired Presentation: Cup;Spoon;Self Fed (fully supported/assisted; 4-5 trials) Oral Phase Impairments: Reduced lingual movement/coordination;Poor awareness of bolus Pharyngeal  Phase Impairments: Suspected delayed Swallow;Multiple swallows;Throat Clearing - Delayed (Reduced coordination) Other Comments: decreased awareness for safe follow through w/ task    Nectar Thick Nectar Thick Liquid: Within functional limits Presentation: Straw;Self Fed (assisted fully; 3 trials)   Honey Thick Honey Thick Liquid: Not tested   Puree Puree: Impaired Presentation: Spoon (fed; 8 trials) Oral Phase Impairments: Reduced lingual movement/coordination;Poor awareness of bolus Oral Phase Functional Implications: Prolonged oral transit Pharyngeal Phase Impairments:  (none noted)   Solid     Solid: Not tested       Jerilynn Som, MS, CCC-SLP [facility] 07/31/2019,4:16 PM

## 2019-07-31 NOTE — ED Notes (Signed)
Patient c/o pain to chest (pointed near esophagus/sternum) and wife stated it seemed like he was gasping for air at one point. Repeated EKG. Dr. Roxan Hockey made aware.

## 2019-07-31 NOTE — Consult Note (Signed)
[facility]  Patient ID: Bruce [name], MRN: [id number], DOB/AGE: [birth year] 86 y.o. Admit date: 07/26/2019   Date of Consult: 07/31/2019 Primary Physician: Marguarite ArbourSparks, Jeffrey D, MD Primary Cardiologist: None  Chief Complaint:  Chief Complaint  Patient presents with  . Hypotension   Reason for Consult: Abnormal EKG with elevated troponin  HPI: 86 y.o. male with apparent diabetes hypertension hyperlipidemia peripheral vascular disease status post previous carotid endarterectomy who has had acute onset of significant concerns of mental status changes despite known issues with dementia.  Patient also had a fall for which there was concerns of rhythm disturbances.  The patient has been in the ED for several days but has suddenly had some new onset of chest discomfort.  With this chest discomfort the patient does have EKG showing normal sinus rhythm left atrial enlargement right bundle branch block and anterior T wave inversions possibly concerning for myocardial infarction although troponin is only 36.  The patient cannot give much history and currently he is not complaining of any significant issues.  Chest x-ray showed no evidence of cardiopulmonary abnormalities.  Currently the patient feels fine  Past Medical History:  Diagnosis Date  . Alzheimer's dementia (HCC)   . Hyperlipidemia   . Hypertension       Surgical History:  Past Surgical History:  Procedure Laterality Date  . Carotid artery endarterectomy    . Intracranial bleeding surgery       Home Meds: Prior to Admission medications   Medication Sig Start Date End Date Taking? Authorizing Provider  amitriptyline (ELAVIL) 25 MG tablet Take 25 mg by mouth at bedtime.   Yes [provider]  aspirin EC 81 MG tablet Take 81 mg by mouth daily. Swallow whole.   Yes [provider]  diphenhydrAMINE (BENADRYL) 25 mg capsule Take 1 capsule by mouth every 6 (six) hours as needed.   Yes  [provider]  docusate sodium (COLACE) 100 MG capsule Take 1 capsule by mouth 2 (two) times daily.   Yes [provider]  doxazosin (CARDURA) 4 MG tablet Take 1 tablet by mouth daily. 05/24/15  Yes [provider]  glimepiride (AMARYL) 2 MG tablet Take 1 tablet by mouth daily. 01/14/16  Yes [provider]  levocetirizine (XYZAL) 5 MG tablet TAKE ONE TABLET EACH EVENING 06/19/19  Yes [provider]  lisinopril (PRINIVIL,ZESTRIL) 20 MG tablet Take 1 tablet by mouth daily. 11/20/15  Yes [provider]  melatonin 5 MG TABS Take 5 mg by mouth.   Yes [provider]  metFORMIN (GLUCOPHAGE) 500 MG tablet Take 1 tablet by mouth 2 (two) times daily. 11/19/15  Yes [provider]  Multiple Vitamin (MULTI-VITAMINS) TABS Take 1 tablet by mouth daily.   Yes [provider]  omeprazole (PRILOSEC) 20 MG capsule Take 1 capsule by mouth daily.   Yes [provider]  simvastatin (ZOCOR) 20 MG tablet Take 1 tablet by mouth daily. 12/11/15  Yes [provider]  tamsulosin (FLOMAX) 0.4 MG CAPS capsule Take 0.4 mg by mouth daily. 06/19/19  Yes [provider]  traZODone (DESYREL) 100 MG tablet SMARTSIG:1 Tablet(s) By Mouth 06/27/19  Yes [provider]  memantine (NAMENDA) 10 MG tablet Take 10 mg by mouth daily. Patient not taking: Reported on 07/26/2019 03/30/19   [provider]  memantine (NAMENDA) 5 MG tablet Take 1 tablet by mouth 2 (two) times daily. Patient not taking: Reported on 07/26/2019 01/14/16   [provider]  Inpatient Medications:  . amitriptyline  25 mg Oral QHS  . aspirin EC  81 mg Oral Daily  . docusate sodium  100 mg Oral BID  . insulin aspart  0-5 Units Subcutaneous QHS  . insulin aspart  0-9 Units Subcutaneous TID WC  . lisinopril  20 mg Oral Daily  . melatonin  5 mg Oral QHS  . [START ON 08/01/2019] methylPREDNISolone (SOLU-MEDROL) injection  40 mg Intravenous  Daily  . multivitamin with minerals  1 tablet Oral Daily  . pantoprazole  40 mg Oral Daily  . [START ON 08/01/2019] pneumococcal 23 valent vaccine  0.5 mL Intramuscular Tomorrow-1000  . simvastatin  20 mg Oral Daily  . traZODone  50 mg Oral QHS     Allergies:  Allergies  Allergen Reactions  . Beef-Derived Products Rash    Social History   Socioeconomic History  . Marital status: Married    Spouse name: Not on file  . Number of children: Not on file  . Years of education: Not on file  . Highest education level: Not on file  Occupational History  . Not on file  Tobacco Use  . Smoking status: Never Smoker  . Smokeless tobacco: Never Used  Substance and Sexual Activity  . Alcohol use: Yes  . Drug use: Never  . Sexual activity: Not on file  Other Topics Concern  . Not on file  Social History Narrative  . Not on file   Social Determinants of Health   Financial Resource Strain:   . Difficulty of Paying Living Expenses:   Food Insecurity:   . Worried About Programme researcher, broadcasting/film/video in the Last Year:   . Barista in the Last Year:   Transportation Needs:   . Freight forwarder (Medical):   Marland Kitchen Lack of Transportation (Non-Medical):   Physical Activity:   . Days of Exercise per Week:   . Minutes of Exercise per Session:   Stress:   . Feeling of Stress :   Social Connections:   . Frequency of Communication with Friends and Family:   . Frequency of Social Gatherings with Friends and Family:   . Attends Religious Services:   . Active Member of Clubs or Organizations:   . Attends Banker Meetings:   Marland Kitchen Marital Status:   Intimate Partner Violence:   . Fear of Current or Ex-Partner:   . Emotionally Abused:   Marland Kitchen Physically Abused:   . Sexually Abused:      Family History  Problem Relation Age of Onset  . Diabetes Mellitus II Brother   . Stroke Brother      Review of Systems Positive for chest pain Negative for: General:  chills, fever, night  sweats or weight changes.  Cardiovascular: PND orthopnea syncope dizziness  Dermatological skin lesions rashes Respiratory: Cough congestion Urologic: Frequent urination urination at night and hematuria Abdominal: negative for nausea, vomiting, diarrhea, bright red blood per rectum, melena, or hematemesis Neurologic: negative for visual changes, and/or hearing changes  All other systems reviewed and are otherwise negative except as noted above.  Labs: No results for input(s): CKTOTAL, CKMB, TROPONINI in the last 72 hours. Lab Results  Component Value Date   WBC 8.5 07/30/2019   HGB 17.9 (H) 07/30/2019   HCT 51.3 07/30/2019   MCV 86.9 07/30/2019   PLT 152 07/30/2019    Recent Labs  Lab 07/29/19 0935 07/29/19 0935 07/30/19 2135  NA 132*   < > 135  K 4.1   < >  4.5  CL 96*   < > 101  CO2 23   < > 23  BUN 20   < > 25*  CREATININE 1.16   < > 1.30*  CALCIUM 9.3   < > 9.6  PROT 7.0  --   --   BILITOT 0.8  --   --   ALKPHOS 54  --   --   ALT 25  --   --   AST 34  --   --   GLUCOSE 136*   < > 143*   < > = values in this interval not displayed.   No results found for: CHOL, HDL, LDLCALC, TRIG No results found for: DDIMER  Radiology/Studies:  DG Chest 1 View  Result Date: 07/26/2019 CLINICAL DATA:  Shortness of breath. EXAM: CHEST  1 VIEW COMPARISON:  Chest x-ray dated February 06, 2016. FINDINGS: The heart size and mediastinal contours are within normal limits. Both lungs are clear. The visualized skeletal structures are unremarkable. IMPRESSION: No active disease. Electronically Signed   By: Obie Dredge M.D.   On: 07/26/2019 12:34   CT Head Wo Contrast  Result Date: 07/29/2019 CLINICAL DATA:  86 year old with acute headache. Normal neuro examination. EXAM: CT HEAD WITHOUT CONTRAST TECHNIQUE: Contiguous axial images were obtained from the base of the skull through the vertex without intravenous contrast. COMPARISON:  12/29/2017 FINDINGS: Brain: Again noted is cerebral atrophy  with enlargement of the ventricles. Lateral ventricles have minimally enlarged in size and may represent increased atrophy. Again noted is low-density in the periventricular and subcortical white matter. Chronic encephalomalacia in the right parietal lobe compatible with old insult. No evidence for acute hemorrhage, mass lesion, midline shift, hydrocephalus or large new infarct. Vascular: No hyperdense vessel or unexpected calcification. Skull: Evidence for an old left craniotomy defect. No acute bone abnormality Sinuses/Orbits: Small amount of disease in the posterior right ethmoid air cells. Other: None IMPRESSION: 1. No acute intracranial abnormality. 2. Cerebral atrophy with chronic white matter changes. Findings are suggestive for chronic small vessel ischemic changes. Evidence for old insult in the right parietal lobe. Electronically Signed   By: Richarda Overlie M.D.   On: 07/29/2019 09:18   CT Cervical Spine Wo Contrast  Result Date: 07/29/2019 CLINICAL DATA:  Poly trauma. EXAM: CT CERVICAL SPINE WITHOUT CONTRAST TECHNIQUE: Multidetector CT imaging of the cervical spine was performed without intravenous contrast. Multiplanar CT image reconstructions were also generated. COMPARISON:  Head CT 07/29/2019.  Facial CT 03/05/2017 FINDINGS: Alignment: Mild kyphosis in the cervical spine at C4-C5. Minimal anterolisthesis at C7-T1. Skull base and vertebrae: No acute fracture. No primary bone lesion or focal pathologic process. Mild bilateral facet arthropathy. Soft tissues and spinal canal: No prevertebral fluid or swelling. No visible canal hematoma. Disc levels: Mild disc space narrowing at C4-C5, C5-C6 and C6-C7. Right foraminal narrowing at C4-C5. Left foraminal narrowing at C5-C6. Upper chest: Negative. Other: None. IMPRESSION: 1. No acute abnormality in the cervical spine. 2. Multilevel degenerative disease. Electronically Signed   By: Richarda Overlie M.D.   On: 07/29/2019 09:29   MR ANGIO HEAD WO CONTRAST  Result  Date: 07/30/2019 CLINICAL DATA:  Ataxia.  Rule out stroke.  Dementia. EXAM: MRI HEAD WITHOUT CONTRAST MRA HEAD WITHOUT CONTRAST TECHNIQUE: Multiplanar, multiecho pulse sequences of the brain and surrounding structures were obtained without intravenous contrast. Angiographic images of the head were obtained using MRA technique without contrast. COMPARISON:  None. FINDINGS: MRI HEAD FINDINGS Brain: Negative for acute infarct Moderate atrophy. Moderate  chronic microvascular ischemic changes in the white matter. 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 Brainstem and cerebellum intact. Negative for hemorrhage or mass. Vascular: Normal arterial flow voids. Skull and upper cervical spine: No focal skeletal lesion. Sinuses/Orbits: Retention cysts in the maxillary sinus bilaterally. Bilateral cataract extraction Other: None MRA HEAD FINDINGS Both vertebral arteries patent to the basilar without significant stenosis. Left PICA patent. Right PICA patent. Prominent right AICA patent. Posterior cerebral arteries patent bilaterally without stenosis. Stenosis at the skull base of the right internal carotid artery likely is artifact. Left internal carotid artery patent. Cavernous carotid patent bilaterally. Mild to moderate stenosis proximal left A2. Right posterior consent right anterior cerebral artery patent. Atherosclerotic irregularity in the middle cerebral artery branches bilaterally. M1 segments patent bilaterally. IMPRESSION: 1. Negative for acute infarct. Moderate atrophy and moderate chronic ischemic change 2. MRA negative for intracranial large vessel occlusion. There is moderate atherosclerotic disease in the middle cerebral artery branches bilaterally. Mild to moderate stenosis left anterior cerebral artery. Electronically Signed   By: Marlan Palau M.D.   On: 07/30/2019 07:24   MR BRAIN WO CONTRAST  Result Date: 07/30/2019 CLINICAL DATA:  Ataxia.  Rule out stroke.  Dementia. EXAM: MRI HEAD WITHOUT  CONTRAST MRA HEAD WITHOUT CONTRAST TECHNIQUE: Multiplanar, multiecho pulse sequences of the brain and surrounding structures were obtained without intravenous contrast. Angiographic images of the head were obtained using MRA technique without contrast. COMPARISON:  None. FINDINGS: MRI HEAD FINDINGS Brain: Negative for acute infarct Moderate atrophy. Moderate chronic microvascular ischemic changes in the white matter. 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 Brainstem and cerebellum intact. Negative for hemorrhage or mass. Vascular: Normal arterial flow voids. Skull and upper cervical spine: No focal skeletal lesion. Sinuses/Orbits: Retention cysts in the maxillary sinus bilaterally. Bilateral cataract extraction Other: None MRA HEAD FINDINGS Both vertebral arteries patent to the basilar without significant stenosis. Left PICA patent. Right PICA patent. Prominent right AICA patent. Posterior cerebral arteries patent bilaterally without stenosis. Stenosis at the skull base of the right internal carotid artery likely is artifact. Left internal carotid artery patent. Cavernous carotid patent bilaterally. Mild to moderate stenosis proximal left A2. Right posterior consent right anterior cerebral artery patent. Atherosclerotic irregularity in the middle cerebral artery branches bilaterally. M1 segments patent bilaterally. IMPRESSION: 1. Negative for acute infarct. Moderate atrophy and moderate chronic ischemic change 2. MRA negative for intracranial large vessel occlusion. There is moderate atherosclerotic disease in the middle cerebral artery branches bilaterally. Mild to moderate stenosis left anterior cerebral artery. Electronically Signed   By: Marlan Palau M.D.   On: 07/30/2019 07:24   DG Chest Portable 1 View  Result Date: 07/29/2019 CLINICAL DATA:  Weakness.  History of dementia. EXAM: PORTABLE CHEST 1 VIEW COMPARISON:  07/28/2019 FINDINGS: Normal sized heart. Tortuous aorta. Clear lungs with normal  vascularity. Lower thoracic spine degenerative changes. IMPRESSION: No acute abnormality. Electronically Signed   By: Beckie Salts M.D.   On: 07/29/2019 15:59   DG Chest Portable 1 View  Result Date: 07/28/2019 CLINICAL DATA:  86 year old male under evaluation for potential to potential TB infection. EXAM: PORTABLE CHEST 1 VIEW COMPARISON:  Chest x-ray 07/26/2019. FINDINGS: Lung volumes are normal. No consolidative airspace disease. No pleural effusions. No pneumothorax. No pulmonary nodule or mass noted. Pulmonary vasculature and the cardiomediastinal silhouette are within normal limits. IMPRESSION: 1. No radiographic findings to suggest active intrathoracic tuberculosis. Electronically Signed   By: Trudie Reed M.D.   On: 07/28/2019 12:47  EKG: Normal sinus rhythm with left atrial enlargement right bundle branch block and anterior T wave inversion cannot rule out previous anterior precordial myocardial infarction  Weights: Filed Weights   07/26/19 1112 07/31/19 1429  Weight: 59 kg 58.6 kg     Physical Exam: Blood pressure 136/73, pulse 73, temperature 98.4 F (36.9 C), temperature source Oral, resp. rate 15, height 5\' 6"  (1.676 m), weight 58.6 kg, SpO2 95 %. Body mass index is 20.84 kg/m. General: Well developed, well nourished, in no acute distress. Head eyes ears nose throat: Normocephalic, atraumatic, sclera non-icteric, no xanthomas, nares are without discharge. No apparent thyromegaly and/or mass  Lungs: Normal respiratory effort.  no wheezes, no rales, few rhonchi.  Heart: RRR with normal S1 S2. no murmur gallop, no rub, PMI is normal size and placement, carotid upstroke normal without bruit, jugular venous pressure is normal Abdomen: Soft, non-tender, non-distended with normoactive bowel sounds. No hepatomegaly. No rebound/guarding. No obvious abdominal masses. Abdominal aorta is normal size without bruit Extremities: Trace edema. no cyanosis, no clubbing, no ulcers   Peripheral : 2+ bilateral upper extremity pulses, 2+ bilateral femoral pulses, 2+ bilateral dorsal pedal pulse Neuro: Not alert and oriented. No facial asymmetry. No focal deficit. Moves all extremities spontaneously. Musculoskeletal: Normal muscle tone without kyphosis Psych: Does not responds to questions appropriately with a normal affect.    Assessment: 86 year old male with hypertension hyperlipidemia diabetes chronic kidney disease stage III peripheral vascular disease with dementia having episodes of chest pain after a fall of unclear etiology and no apparent myocardial infarction with low troponin although abnormal EKG not seen before  Plan: 1.  Continue supportive care of fall and dementia and other significant symptoms 2.  Continue lisinopril for hypertension control aspirin for possibility of myocardial infarction and cardiovascular disease with peripheral vascular disease noted 3.  Reasonable for echocardiogram to assess LV dysfunction valvular heart disease causing abnormal EKG which previously echocardiogram in 2018 was normal 4.  Continue to follow for recurrence of chest discomfort and need for additional treatment despite currently no evidence of acute coronary syndrome  Signed, 2019 M.D. [facility] Cardiology 07/31/2019, 7:20 PM

## 2019-08-01 ENCOUNTER — Observation Stay: Admit: 2019-08-01 | Payer: Medicare Other

## 2019-08-01 ENCOUNTER — Observation Stay
Admit: 2019-08-01 | Discharge: 2019-08-01 | Disposition: A | Discharge status: Home / Self Care | Payer: Medicare Other | Attending: Internal Medicine | Admitting: Internal Medicine

## 2019-08-01 DIAGNOSIS — Z23 Encounter for immunization: Secondary | ICD-10-CM | POA: Diagnosis present

## 2019-08-01 DIAGNOSIS — I129 Hypertensive chronic kidney disease with stage 1 through stage 4 chronic kidney disease, or unspecified chronic kidney disease: Secondary | ICD-10-CM | POA: Diagnosis present

## 2019-08-01 DIAGNOSIS — Z6820 Body mass index (BMI) 20.0-20.9, adult: Secondary | ICD-10-CM | POA: Diagnosis not present

## 2019-08-01 DIAGNOSIS — E1151 Type 2 diabetes mellitus with diabetic peripheral angiopathy without gangrene: Secondary | ICD-10-CM | POA: Diagnosis present

## 2019-08-01 DIAGNOSIS — I4510 Unspecified right bundle-branch block: Secondary | ICD-10-CM | POA: Diagnosis present

## 2019-08-01 DIAGNOSIS — J189 Pneumonia, unspecified organism: Secondary | ICD-10-CM | POA: Diagnosis present

## 2019-08-01 DIAGNOSIS — Y92009 Unspecified place in unspecified non-institutional (private) residence as the place of occurrence of the external cause: Secondary | ICD-10-CM | POA: Diagnosis not present

## 2019-08-01 DIAGNOSIS — W19XXXA Unspecified fall, initial encounter: Secondary | ICD-10-CM | POA: Diagnosis present

## 2019-08-01 DIAGNOSIS — Z515 Encounter for palliative care: Secondary | ICD-10-CM | POA: Diagnosis present

## 2019-08-01 DIAGNOSIS — R5381 Other malaise: Secondary | ICD-10-CM | POA: Diagnosis present

## 2019-08-01 DIAGNOSIS — E43 Unspecified severe protein-calorie malnutrition: Secondary | ICD-10-CM | POA: Diagnosis present

## 2019-08-01 DIAGNOSIS — R21 Rash and other nonspecific skin eruption: Secondary | ICD-10-CM | POA: Diagnosis present

## 2019-08-01 DIAGNOSIS — R627 Adult failure to thrive: Secondary | ICD-10-CM | POA: Diagnosis present

## 2019-08-01 DIAGNOSIS — G309 Alzheimer's disease, unspecified: Secondary | ICD-10-CM | POA: Diagnosis present

## 2019-08-01 DIAGNOSIS — R079 Chest pain, unspecified: Secondary | ICD-10-CM

## 2019-08-01 DIAGNOSIS — E785 Hyperlipidemia, unspecified: Secondary | ICD-10-CM | POA: Diagnosis present

## 2019-08-01 DIAGNOSIS — F329 Major depressive disorder, single episode, unspecified: Secondary | ICD-10-CM | POA: Diagnosis present

## 2019-08-01 DIAGNOSIS — Z20822 Contact with and (suspected) exposure to covid-19: Secondary | ICD-10-CM | POA: Diagnosis present

## 2019-08-01 DIAGNOSIS — F0280 Dementia in other diseases classified elsewhere without behavioral disturbance: Secondary | ICD-10-CM

## 2019-08-01 DIAGNOSIS — Z66 Do not resuscitate: Secondary | ICD-10-CM | POA: Diagnosis present

## 2019-08-01 DIAGNOSIS — Z7189 Other specified counseling: Secondary | ICD-10-CM | POA: Diagnosis not present

## 2019-08-01 DIAGNOSIS — Z833 Family history of diabetes mellitus: Secondary | ICD-10-CM | POA: Diagnosis not present

## 2019-08-01 DIAGNOSIS — E1122 Type 2 diabetes mellitus with diabetic chronic kidney disease: Secondary | ICD-10-CM | POA: Diagnosis present

## 2019-08-01 DIAGNOSIS — N1831 Chronic kidney disease, stage 3a: Secondary | ICD-10-CM | POA: Diagnosis present

## 2019-08-01 DIAGNOSIS — G9341 Metabolic encephalopathy: Secondary | ICD-10-CM | POA: Diagnosis present

## 2019-08-01 DIAGNOSIS — I959 Hypotension, unspecified: Secondary | ICD-10-CM | POA: Diagnosis present

## 2019-08-01 DIAGNOSIS — L309 Dermatitis, unspecified: Secondary | ICD-10-CM | POA: Diagnosis present

## 2019-08-01 DIAGNOSIS — K219 Gastro-esophageal reflux disease without esophagitis: Secondary | ICD-10-CM | POA: Diagnosis present

## 2019-08-01 LAB — BASIC METABOLIC PANEL
Anion gap: 9 (ref 5–15)
BUN: 27 mg/dL — ABNORMAL HIGH (ref 8–23)
CO2: 23 mmol/L (ref 22–32)
Calcium: 9.3 mg/dL (ref 8.9–10.3)
Chloride: 104 mmol/L (ref 98–111)
Creatinine, Ser: 1.09 mg/dL (ref 0.61–1.24)
GFR calc Af Amer: >60 mL/min (ref >60)
GFR calc non Af Amer: >60 mL/min (ref >60)
Glucose, Bld: 149 mg/dL — ABNORMAL HIGH (ref 70–99)
Potassium: 4.2 mmol/L (ref 3.5–5.1)
Sodium: 136 mmol/L (ref 135–145)

## 2019-08-01 LAB — GLUCOSE, CAPILLARY
Glucose-Capillary: 146 mg/dL — ABNORMAL HIGH (ref 70–99)
Glucose-Capillary: 192 mg/dL — ABNORMAL HIGH (ref 70–99)
Glucose-Capillary: 239 mg/dL — ABNORMAL HIGH (ref 70–99)
Glucose-Capillary: 165 mg/dL — ABNORMAL HIGH (ref 70–99)

## 2019-08-01 LAB — ECHOCARDIOGRAM COMPLETE
AR max vel: 1.86 cm2
AV Area VTI: 1.99 cm2
AV Area mean vel: 1.7 cm2
AV Mean grad: 5 mmHg
AV Peak grad: 8.4 mmHg
Ao pk vel: 1.45 m/s
Area-P 1/2: 3.21 cm2
Height: 66 in
Single Plane A4C EF: 60.7 %
Weight: 2065.6 oz

## 2019-08-01 LAB — CBC
HCT: 46.3 % (ref 39.0–52.0)
Hemoglobin: 16.5 g/dL (ref 13.0–17.0)
MCH: 30.6 pg (ref 26.0–34.0)
MCHC: 35.6 g/dL (ref 30.0–36.0)
MCV: 85.9 fL (ref 80.0–100.0)
Platelets: 150 10*3/uL (ref 150–400)
RBC: 5.39 MIL/uL (ref 4.22–5.81)
RDW: 12.8 % (ref 11.5–15.5)
WBC: 8.8 10*3/uL (ref 4.0–10.5)
nRBC: 0 % (ref 0.0–0.2)

## 2019-08-01 LAB — HEMOGLOBIN A1C
Hgb A1c MFr Bld: 6.9 % — ABNORMAL HIGH (ref 4.8–5.6)
Mean Plasma Glucose: 151 mg/dL

## 2019-08-01 LAB — LIPID PANEL
Cholesterol: 125 mg/dL (ref 0–200)
HDL: 47 mg/dL (ref >40)
LDL Cholesterol: 65 mg/dL (ref 0–99)
Total CHOL/HDL Ratio: 2.7 RATIO
Triglycerides: 66 mg/dL (ref <150)
VLDL: 13 mg/dL (ref 0–40)

## 2019-08-01 LAB — TROPONIN I (HIGH SENSITIVITY): Troponin I (High Sensitivity): 38 ng/L — ABNORMAL HIGH (ref <18)

## 2019-08-01 MED ORDER — ENOXAPARIN SODIUM 40 MG/0.4ML ~~LOC~~ SOLN
40.0000 mg | SUBCUTANEOUS | Status: DC
  Administered 2019-08-01: 40 mg via SUBCUTANEOUS
  Filled 2019-08-01: qty 0.4

## 2019-08-01 MED ORDER — NEPRO/CARBSTEADY PO LIQD
237.0000 mL | Freq: Two times a day (BID) | ORAL | Status: DC
  Administered 2019-08-01 – 2019-08-02 (×2): 237 mL via ORAL

## 2019-08-01 NOTE — Progress Notes (Signed)
OT Cancellation Note  Patient Details Name: Bruce [name] MRN: [id number] DOB: 04-11-1932   Cancelled Treatment:    Reason Eval/Treat Not Completed: Patient at procedure or test/ unavailable. OT order received and chart reviewed. Upon arrival, pt completing bed side procedure c MD present. Will follow up at later time/date as able.   Kathie Dike, M.S. OTR/L  08/01/19, 10:59 AM

## 2019-08-01 NOTE — Progress Notes (Signed)
Initial Nutrition Assessment  DOCUMENTATION CODES:   Severe malnutrition in context of social or environmental circumstances  INTERVENTION:   Nepro Shake po BID, each supplement provides 425 kcal and 19 grams protein  MVI daily   NUTRITION DIAGNOSIS:   Severe Malnutrition related to social / environmental circumstances (dementia, advanced age) as evidenced by moderate fat depletion, severe fat depletion, severe muscle depletion.  GOAL:   Patient will meet greater than or equal to 90% of their needs  MONITOR:   PO intake, Supplement acceptance, Labs, Weight trends, Skin, I & O's  REASON FOR ASSESSMENT:   Malnutrition Screening Tool    ASSESSMENT:   86 y.o. male with medical history significant of hypertension, hyperlipidemia, dementia, diabetes mellitus, GERD, depression, eczema, SDH, carotid artery stenosis (endarterectomy), CKD-3, who presents with fall, chest pain and rash on back.   Met with pt and family in room today. Pt unable to provide any history so history obtained from wife at bedside. Wife reports that pt with fairly good appetite and oral intake at baseline. Pt requires full assist with meals at home. Wife reports that pt was eating pretty good up until he was placed on a dysphagia diet; wife reports that pt does not like the pureed food or thickened liquids. Pt ate only bites of his breakfast this morning. RD will add supplements to help pt meet his estimated needs. Per chart, pt is weight stable pta. Of note, pt has a beef allergy; per wife, pt gets a rash after eating beef.   Medications reviewed and include: aspirin, colace, lovenox, insulin, melatonin, solu-medrol, MVI, protonix  Labs reviewed: BUN 27(H) cbgs- 146, 192 x 24 hrs AIC 6.9(H)- 7/18  NUTRITION - FOCUSED PHYSICAL EXAM:    Most Recent Value  Orbital Region Moderate depletion  Upper Arm Region Severe depletion  Thoracic and Lumbar Region Severe depletion  Buccal Region Moderate depletion   Temple Region Moderate depletion  Clavicle Bone Region Severe depletion  Clavicle and Acromion Bone Region Severe depletion  Scapular Bone Region Severe depletion  Dorsal Hand Severe depletion  Patellar Region Severe depletion  Anterior Thigh Region Severe depletion  Posterior Calf Region Severe depletion  Edema (RD Assessment) None  Hair Reviewed  Eyes Reviewed  Mouth Reviewed  Skin Reviewed  Nails Reviewed     Diet Order:   Diet Order            DIET - DYS 1 Room service appropriate? Yes with Assist; Fluid consistency: Nectar Thick  Diet effective now                EDUCATION NEEDS:   No education needs have been identified at this time  Skin:  Skin Assessment: Reviewed RN Assessment  Last BM:  pta  Height:   Ht Readings from Last 1 Encounters:  07/31/19 _0  (1.676 m)    Weight:   Wt Readings from Last 1 Encounters:  07/31/19 58.6 kg    Ideal Body Weight:  64.5 kg  BMI:  Body mass index is 20.84 kg/m.  Estimated Nutritional Needs:   Kcal:  1600-1800kcal/day  Protein:  80-90g/day  Fluid:  >1.5L/day  Koleen Distance MS, RD, LDN Please refer to [facility] for RD and/or RD on-call/weekend/after hours pager\

## 2019-08-01 NOTE — Progress Notes (Signed)
[facility]  Patient: Rae RoamWalter H Manz / Admit Date: 07/26/2019 / Date of Encounter: 08/01/2019, 1:52 PM   Subjective: No significant changes or improvements of patient from yesterday.  Patient does not have any concerns of chest pain today although difficulty with conversation.  Troponin level peaked at 386.  Patient has hypertension hyperlipidemia diabetes chronic kidney disease and peripheral vascular disease with abnormal EKG not significantly changed from before Echocardiogram showing normal LV systolic function and no significant valvular heart disease  Review of Systems: Cannot assess  Objective: Telemetry: Normal sinus rhythm Physical Exam: Blood pressure 110/60, pulse (!) 59, temperature 98.4 F (36.9 C), resp. rate 18, height 5\' 6"  (1.676 m), weight 58.6 kg, SpO2 94 %. Body mass index is 20.84 kg/m. General: Well developed, well nourished, in no acute distress. Head: Normocephalic, atraumatic, sclera non-icteric, no xanthomas, nares are without discharge. Neck: No apparent masses Lungs: Normal respirations with few wheezes, some rhonchi, no rales , no crackles   Heart: Irregular rate and rhythm, normal S1 S2, no murmur, no rub, no gallop, PMI is normal size and placement, carotid upstroke normal without bruit, jugular venous pressure normal Abdomen: Soft, non-tender, non-distended with normoactive bowel sounds. No hepatosplenomegaly. Abdominal aorta is normal size without bruit Extremities: Trace edema, no clubbing, no cyanosis, no ulcers,  Peripheral: 2+ radial, 2+ femoral, 2+ dorsal pedal pulses Neuro: Is not alert and oriented. Moves all extremities spontaneously. Psych: Does not responds to questions appropriately with a normal affect.   Intake/Output Summary (Last 24 hours) at 08/01/2019 1352 Last data filed at 08/01/2019 1129 Gross per 24 hour  Intake --  Output 300 ml  Net -300 ml    Inpatient Medications:  . amitriptyline  25  mg Oral QHS  . aspirin EC  81 mg Oral Daily  . docusate sodium  100 mg Oral BID  . enoxaparin (LOVENOX) injection  40 mg Subcutaneous Q24H  . insulin aspart  0-5 Units Subcutaneous QHS  . insulin aspart  0-9 Units Subcutaneous TID WC  . lisinopril  20 mg Oral Daily  . melatonin  5 mg Oral QHS  . methylPREDNISolone (SOLU-MEDROL) injection  40 mg Intravenous Daily  . multivitamin with minerals  1 tablet Oral Daily  . pantoprazole  40 mg Oral Daily  . simvastatin  20 mg Oral Daily  . traZODone  50 mg Oral QHS   Infusions:   Labs: Recent Labs    07/30/19 2135 08/01/19 0907  NA 135 136  K 4.5 4.2  CL 101 104  CO2 23 23  GLUCOSE 143* 149*  BUN 25* 27*  CREATININE 1.30* 1.09  CALCIUM 9.6 9.3   No results for input(s): AST, ALT, ALKPHOS, BILITOT, PROT, ALBUMIN in the last 72 hours. Recent Labs    07/30/19 2135 08/01/19 0907  WBC 8.5 8.8  NEUTROABS 6.4  --   HGB 17.9* 16.5  HCT 51.3 46.3  MCV 86.9 85.9  PLT 152 150   No results for input(s): CKTOTAL, CKMB, TROPONINI in the last 72 hours. Invalid input(s): POCBNP Recent Labs    07/30/19 2135  HGBA1C 6.9*     Weights: Filed Weights   07/26/19 1112 07/31/19 1429  Weight: 59 kg 58.6 kg     Radiology/Studies:  DG Chest 1 View  Result Date: 07/26/2019 CLINICAL DATA:  Shortness of breath. EXAM: CHEST  1 VIEW COMPARISON:  Chest x-ray dated February 06, 2016. FINDINGS: The heart size and mediastinal contours are within normal limits. Both  lungs are clear. The visualized skeletal structures are unremarkable. IMPRESSION: No active disease. Electronically Signed   By: Obie Dredge M.D.   On: 07/26/2019 12:34   CT Head Wo Contrast  Result Date: 07/29/2019 CLINICAL DATA:  86 year old with acute headache. Normal neuro examination. EXAM: CT HEAD WITHOUT CONTRAST TECHNIQUE: Contiguous axial images were obtained from the base of the skull through the vertex without intravenous contrast. COMPARISON:  12/29/2017 FINDINGS: Brain:  Again noted is cerebral atrophy with enlargement of the ventricles. Lateral ventricles have minimally enlarged in size and may represent increased atrophy. Again noted is low-density in the periventricular and subcortical white matter. Chronic encephalomalacia in the right parietal lobe compatible with old insult. No evidence for acute hemorrhage, mass lesion, midline shift, hydrocephalus or large new infarct. Vascular: No hyperdense vessel or unexpected calcification. Skull: Evidence for an old left craniotomy defect. No acute bone abnormality Sinuses/Orbits: Small amount of disease in the posterior right ethmoid air cells. Other: None IMPRESSION: 1. No acute intracranial abnormality. 2. Cerebral atrophy with chronic white matter changes. Findings are suggestive for chronic small vessel ischemic changes. Evidence for old insult in the right parietal lobe. Electronically Signed   By: Richarda Overlie M.D.   On: 07/29/2019 09:18   CT Cervical Spine Wo Contrast  Result Date: 07/29/2019 CLINICAL DATA:  Poly trauma. EXAM: CT CERVICAL SPINE WITHOUT CONTRAST TECHNIQUE: Multidetector CT imaging of the cervical spine was performed without intravenous contrast. Multiplanar CT image reconstructions were also generated. COMPARISON:  Head CT 07/29/2019.  Facial CT 03/05/2017 FINDINGS: Alignment: Mild kyphosis in the cervical spine at C4-C5. Minimal anterolisthesis at C7-T1. Skull base and vertebrae: No acute fracture. No primary bone lesion or focal pathologic process. Mild bilateral facet arthropathy. Soft tissues and spinal canal: No prevertebral fluid or swelling. No visible canal hematoma. Disc levels: Mild disc space narrowing at C4-C5, C5-C6 and C6-C7. Right foraminal narrowing at C4-C5. Left foraminal narrowing at C5-C6. Upper chest: Negative. Other: None. IMPRESSION: 1. No acute abnormality in the cervical spine. 2. Multilevel degenerative disease. Electronically Signed   By: Richarda Overlie M.D.   On: 07/29/2019 09:29   MR  ANGIO HEAD WO CONTRAST  Result Date: 07/30/2019 CLINICAL DATA:  Ataxia.  Rule out stroke.  Dementia. EXAM: MRI HEAD WITHOUT CONTRAST MRA HEAD WITHOUT CONTRAST TECHNIQUE: Multiplanar, multiecho pulse sequences of the brain and surrounding structures were obtained without intravenous contrast. Angiographic images of the head were obtained using MRA technique without contrast. COMPARISON:  None. FINDINGS: MRI HEAD FINDINGS Brain: Negative for acute infarct Moderate atrophy. Moderate chronic microvascular ischemic changes in the white matter. 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 Brainstem and cerebellum intact. Negative for hemorrhage or mass. Vascular: Normal arterial flow voids. Skull and upper cervical spine: No focal skeletal lesion. Sinuses/Orbits: Retention cysts in the maxillary sinus bilaterally. Bilateral cataract extraction Other: None MRA HEAD FINDINGS Both vertebral arteries patent to the basilar without significant stenosis. Left PICA patent. Right PICA patent. Prominent right AICA patent. Posterior cerebral arteries patent bilaterally without stenosis. Stenosis at the skull base of the right internal carotid artery likely is artifact. Left internal carotid artery patent. Cavernous carotid patent bilaterally. Mild to moderate stenosis proximal left A2. Right posterior consent right anterior cerebral artery patent. Atherosclerotic irregularity in the middle cerebral artery branches bilaterally. M1 segments patent bilaterally. IMPRESSION: 1. Negative for acute infarct. Moderate atrophy and moderate chronic ischemic change 2. MRA negative for intracranial large vessel occlusion. There is moderate atherosclerotic disease in the middle  cerebral artery branches bilaterally. Mild to moderate stenosis left anterior cerebral artery. Electronically Signed   By: Marlan Palau M.D.   On: 07/30/2019 07:24   MR BRAIN WO CONTRAST  Result Date: 07/30/2019 CLINICAL DATA:  Ataxia.  Rule out stroke.   Dementia. EXAM: MRI HEAD WITHOUT CONTRAST MRA HEAD WITHOUT CONTRAST TECHNIQUE: Multiplanar, multiecho pulse sequences of the brain and surrounding structures were obtained without intravenous contrast. Angiographic images of the head were obtained using MRA technique without contrast. COMPARISON:  None. FINDINGS: MRI HEAD FINDINGS Brain: Negative for acute infarct Moderate atrophy. Moderate chronic microvascular ischemic changes in the white matter. 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 Brainstem and cerebellum intact. Negative for hemorrhage or mass. Vascular: Normal arterial flow voids. Skull and upper cervical spine: No focal skeletal lesion. Sinuses/Orbits: Retention cysts in the maxillary sinus bilaterally. Bilateral cataract extraction Other: None MRA HEAD FINDINGS Both vertebral arteries patent to the basilar without significant stenosis. Left PICA patent. Right PICA patent. Prominent right AICA patent. Posterior cerebral arteries patent bilaterally without stenosis. Stenosis at the skull base of the right internal carotid artery likely is artifact. Left internal carotid artery patent. Cavernous carotid patent bilaterally. Mild to moderate stenosis proximal left A2. Right posterior consent right anterior cerebral artery patent. Atherosclerotic irregularity in the middle cerebral artery branches bilaterally. M1 segments patent bilaterally. IMPRESSION: 1. Negative for acute infarct. Moderate atrophy and moderate chronic ischemic change 2. MRA negative for intracranial large vessel occlusion. There is moderate atherosclerotic disease in the middle cerebral artery branches bilaterally. Mild to moderate stenosis left anterior cerebral artery. Electronically Signed   By: Marlan Palau M.D.   On: 07/30/2019 07:24   DG Chest Portable 1 View  Result Date: 07/29/2019 CLINICAL DATA:  Weakness.  History of dementia. EXAM: PORTABLE CHEST 1 VIEW COMPARISON:  07/28/2019 FINDINGS: Normal sized heart.  Tortuous aorta. Clear lungs with normal vascularity. Lower thoracic spine degenerative changes. IMPRESSION: No acute abnormality. Electronically Signed   By: Beckie Salts M.D.   On: 07/29/2019 15:59   DG Chest Portable 1 View  Result Date: 07/28/2019 CLINICAL DATA:  86 year old male under evaluation for potential to potential TB infection. EXAM: PORTABLE CHEST 1 VIEW COMPARISON:  Chest x-ray 07/26/2019. FINDINGS: Lung volumes are normal. No consolidative airspace disease. No pleural effusions. No pneumothorax. No pulmonary nodule or mass noted. Pulmonary vasculature and the cardiomediastinal silhouette are within normal limits. IMPRESSION: 1. No radiographic findings to suggest active intrathoracic tuberculosis. Electronically Signed   By: Trudie Reed M.D.   On: 07/28/2019 12:47   ECHOCARDIOGRAM COMPLETE  Result Date: 08/01/2019    ECHOCARDIOGRAM REPORT   Patient Name:   ORION MOLE Date of Exam: 08/01/2019 Medical Rec #:  [id number]       Height:       66.0 in Accession #:    [id number]      Weight:       129.1 lb Date of Birth:  [birth year]      BSA:          1.660 m Patient Age:    86 years        BP:           107/68 mmHg Patient Gender: M               HR:           54 bpm. Exam Location:  [facility] Procedure: 2D Echo, Color Doppler and Cardiac Doppler Indications:     I21.4  NSTEMI  History:         Patient has no prior history of Echocardiogram examinations.                  Risk Factors:Hypertension and Dyslipidemia. Alzheimer's                  disease.  Sonographer:     Humphrey Rolls RDCS (AE) Referring Phys:  [id number] Lamar Blinks Diagnosing Phys: Arnoldo Hooker MD  Sonographer Comments: Technically challenging study due to limited acoustic windows. Image acquisition challenging due to uncooperative patient. IMPRESSIONS  1. Left ventricular ejection fraction, by estimation, is 45 to 50%. The left ventricle has mildly decreased function. The left ventricle has no regional wall motion abnormalities.  Left ventricular diastolic parameters were normal.  2. Right ventricular systolic function is normal. The right ventricular size is normal.  3. The mitral valve is grossly normal. Trivial mitral valve regurgitation.  4. The aortic valve is grossly normal. Aortic valve regurgitation is not visualized. FINDINGS  Left Ventricle: Left ventricular ejection fraction, by estimation, is 45 to 50%. The left ventricle has mildly decreased function. The left ventricle has no regional wall motion abnormalities. The left ventricular internal cavity size was small. There is no left ventricular hypertrophy. Left ventricular diastolic parameters were normal. Right Ventricle: The right ventricular size is normal. No increase in right ventricular wall thickness. Right ventricular systolic function is normal. Left Atrium: Left atrial size was normal in size. Right Atrium: Right atrial size was normal in size. Pericardium: There is no evidence of pericardial effusion. Mitral Valve: The mitral valve is grossly normal. Trivial mitral valve regurgitation. MV peak gradient, 3.2 mmHg. The mean mitral valve gradient is 1.0 mmHg. Tricuspid Valve: The tricuspid valve is grossly normal. Tricuspid valve regurgitation is trivial. Aortic Valve: The aortic valve is grossly normal. Aortic valve regurgitation is not visualized. Aortic valve mean gradient measures 5.0 mmHg. Aortic valve peak gradient measures 8.4 mmHg. Aortic valve area, by VTI measures 1.99 cm. Pulmonic Valve: The pulmonic valve was normal in structure. Pulmonic valve regurgitation is not visualized. Aorta: The aortic root and ascending aorta are structurally normal, with no evidence of dilitation. IAS/Shunts: No atrial level shunt detected by color flow Doppler.  LEFT VENTRICLE PLAX 2D LVOT diam:     1.70 cm     Diastology LV SV:         59          LV e' lateral:   7.83 cm/s LV SV Index:   35          LV E/e' lateral: 7.6 LVOT Area:     2.27 cm    LV e' medial:    5.55 cm/s                             LV E/e' medial:  10.7  LV Volumes (MOD) LV vol d, MOD A4C: 42.8 ml LV vol s, MOD A4C: 16.8 ml LV SV MOD A4C:     42.8 ml LEFT ATRIUM           Index LA Vol (A4C): 21.7 ml 13.07 ml/m  AORTIC VALVE AV Area (Vmax):    1.86 cm AV Area (Vmean):   1.70 cm AV Area (VTI):     1.99 cm AV Vmax:           145.00 cm/s AV Vmean:  106.000 cm/s AV VTI:            0.296 m AV Peak Grad:      8.4 mmHg AV Mean Grad:      5.0 mmHg LVOT Vmax:         119.00 cm/s LVOT Vmean:        79.400 cm/s LVOT VTI:          0.259 m LVOT/AV VTI ratio: 0.88 MITRAL VALVE MV Area (PHT): 3.21 cm    SHUNTS MV Peak grad:  3.2 mmHg    Systemic VTI:  0.26 m MV Mean grad:  1.0 mmHg    Systemic Diam: 1.70 cm MV Vmax:       0.90 m/s MV Vmean:      34.2 cm/s MV Decel Time: 236 msec MV E velocity: 59.20 cm/s MV A velocity: 64.50 cm/s MV E/A ratio:  0.92 Arnoldo Hooker MD Electronically signed by Arnoldo Hooker MD Signature Date/Time: 08/01/2019/12:50:28 PM    Final      Assessment and Recommendation  86 y.o. male with known chronic kidney disease stage III peripheral vascular disease diabetes hypertension hyperlipidemia with a fall and chest discomfort and severe dementia for which she is not had any apparent no myocardial infarction or congestive heart failure at this time with normal LV function and no evidence of significant valvular heart disease 1.  Continue supportive care of fall and causes of fall 2.  Continue medication management and treatment for risk reduction cardiovascular event including aspirin ACE inhibitor and statin therapy 3.  No further cardiac diagnostics necessary at this time 4.  May ambulate patient as needed without restriction 5.  Call if further questions  Signed, Arnoldo Hooker M.D. FACC

## 2019-08-01 NOTE — Progress Notes (Signed)
PROGRESS NOTE    Bruce [name]  MIW:[id number] DOB: 11-06-1932 DOA: 07/26/2019 PCP: [name], MD   Assessment & Plan:   Principal Problem:   Chest pain Active Problems:   Hyperlipidemia   Type II diabetes mellitus with renal manifestations (HCC)   Dementia without behavioral disturbance (HCC)   Fall   HTN (hypertension)   Rash   CKD (chronic kidney disease), stage IIIa   Elevated troponin   GERD (gastroesophageal reflux disease)   Depression   Chest pain: w/ elevated trop: trop 13 -->9 -->36.  EKG has old T wave inversion in precordial leads. ACS r/o as per cardio. Echo shows EF 45-50%, no wall motion abnormality, normal diastolic dysfunction. Continue on tele. Continue on aspirin, statin. Morphine, nitro prn   Hyperlipidemia: continue on statin   DM2: Recent A1c 6.9, well controlled. Continue to hold home dose of metformin and amaryl. Continue on SSI w/ accuchecks   Dementia: without behavioral disturbance. Used to be on Namenda which was discontinued by neurologist  Fall: PT/OT consulted. Will likely need to be d/c to SNF   HTN: continue on lisinopril. Hydralazine prn   Rash: etiology unclear. Continue on solumedrol and benadryl  CKDstage IIIa: stable. Will continue to monitor   GERD: continue on PPI   Depression: severity unknown. Continue on home dose of amitriptyline   DVT prophylaxis: lovenox Code Status: full  Family Communication: discussed pt's care w/ pt's wife who was at bedside and answered her questions Disposition Plan: likely d/c to SNF. Awaiting PT/OT recs. Of note, pt was laying in the ED for a week for some reason before being transferred to the hospitalist service    Consultants:  Cardio  Procedures:    Antimicrobials:    Status is: Inpatient  Remains inpatient appropriate because:Unsafe d/c plan   Dispo: The patient is from: Home              Anticipated d/c is to: SNF              Anticipated d/c date is: 3  days              Patient currently is not medically stable to d/c.        Subjective: Pt is unable to answer questions appropriately   Objective: Vitals:   07/31/19 1728 07/31/19 1938 08/01/19 0511 08/01/19 0723  BP: 136/73 124/67 118/60 107/68  Pulse: 73 81 (!) 109 67  Resp: 15 19 20 18   Temp: 98.4 F (36.9 C) 98.2 F (36.8 C) 97.6 F (36.4 C) 98.4 F (36.9 C)  TempSrc: Oral     SpO2: 95% 94% 92% 93%  Weight:      Height:       No intake or output data in the 24 hours ending 08/01/19 0835 Filed Weights   07/26/19 1112 07/31/19 1429  Weight: 59 kg 58.6 kg    Examination:  General exam: Appears calm and comfortable  Respiratory system: Clear to auscultation. No rales, rhonchi Cardiovascular system: S1 & S2 +. No rubs, gallops or clicks.  Gastrointestinal system: Abdomen is nondistended, soft and nontender.Normal bowel sounds heard. Central nervous system: Alert and awake.  Psychiatry: Judgement and insight appear abnormal. Flat mood and affect     Data Reviewed: I have personally reviewed following labs and imaging studies  CBC: Recent Labs  Lab 07/26/19 1119 07/29/19 0935 07/30/19 2135  WBC 6.0 6.2 8.5  NEUTROABS  --  3.5 6.4  HGB 14.7 16.4 17.9*  HCT 42.5 47.5 51.3  MCV 87.8 87.6 86.9  PLT 142* 147* 152   Basic Metabolic Panel: Recent Labs  Lab 07/26/19 1119 07/29/19 0935 07/30/19 2135  NA 130* 132* 135  K 4.7 4.1 4.5  CL 94* 96* 101  CO2 23 23 23   GLUCOSE 132* 136* 143*  BUN 20 20 25*  CREATININE 1.36* 1.16 1.30*  CALCIUM 9.3 9.3 9.6   GFR: Estimated Creatinine Clearance: 35.1 mL/min (A) (by C-G formula based on SCr of 1.3 mg/dL (H)). Liver Function Tests: Recent Labs  Lab 07/29/19 0935  AST 34  ALT 25  ALKPHOS 54  BILITOT 0.8  PROT 7.0  ALBUMIN 4.2   No results for input(s): LIPASE, AMYLASE in the last 168 hours. No results for input(s): AMMONIA in the last 168 hours. Coagulation Profile: No results for input(s): INR,  PROTIME in the last 168 hours. Cardiac Enzymes: No results for input(s): CKTOTAL, CKMB, CKMBINDEX, TROPONINI in the last 168 hours. BNP (last 3 results) No results for input(s): PROBNP in the last 8760 hours. HbA1C: Recent Labs    07/30/19 2135  HGBA1C 6.9*   CBG: Recent Labs  Lab 07/29/19 0750 07/31/19 1447 07/31/19 1725 07/31/19 2054 08/01/19 0725  GLUCAP 121* 202* 237* 176* 146*   Lipid Profile: Recent Labs    08/01/19 0524  CHOL 125  HDL 47  LDLCALC 65  TRIG 66  CHOLHDL 2.7   Thyroid Function Tests: No results for input(s): TSH, T4TOTAL, FREET4, T3FREE, THYROIDAB in the last 72 hours. Anemia Panel: No results for input(s): VITAMINB12, FOLATE, FERRITIN, TIBC, IRON, RETICCTPCT in the last 72 hours. Sepsis Labs: No results for input(s): PROCALCITON, LATICACIDVEN in the last 168 hours.  Recent Results (from the past 240 hour(s))  SARS Coronavirus 2 by RT PCR (hospital order, performed in [facility] hospital lab) Nasopharyngeal Nasopharyngeal Swab     Status: None   Collection Time: 07/29/19 12:11 PM   Specimen: Nasopharyngeal Swab  Result Value Ref Range Status   SARS Coronavirus 2 NEGATIVE NEGATIVE Final    Comment: (NOTE) SARS-CoV-2 target nucleic acids are NOT DETECTED.  The SARS-CoV-2 RNA is generally detectable in upper and lower respiratory specimens during the acute phase of infection. The lowest concentration of SARS-CoV-2 viral copies this assay can detect is 250 copies / mL. A negative result does not preclude SARS-CoV-2 infection and should not be used as the sole basis for treatment or other patient management decisions.  A negative result may occur with improper specimen collection / handling, submission of specimen other than nasopharyngeal swab, presence of viral mutation(s) within the areas targeted by this assay, and inadequate number of viral copies (<250 copies / mL). A negative result must be combined with clinical observations, patient  history, and epidemiological information.  Fact Sheet for Patients:   07/31/19  Fact Sheet for Healthcare Providers: BoilerBrush.com.cy  This test is not yet approved or  cleared by the https://pope.com/ FDA and has been authorized for detection and/or diagnosis of SARS-CoV-2 by FDA under an Emergency Use Authorization (EUA).  This EUA will remain in effect (meaning this test can be used) for the duration of the COVID-19 declaration under Section 564(b)(1) of the Act, 21 U.S.C. section 360bbb-3(b)(1), unless the authorization is terminated or revoked sooner.  Performed at [facility], [address], [place], [place]          Radiology Studies: No results found.      Scheduled Meds:  amitriptyline  25 mg  Oral QHS   aspirin EC  81 mg Oral Daily   docusate sodium  100 mg Oral BID   insulin aspart  0-5 Units Subcutaneous QHS   insulin aspart  0-9 Units Subcutaneous TID WC   lisinopril  20 mg Oral Daily   melatonin  5 mg Oral QHS   methylPREDNISolone (SOLU-MEDROL) injection  40 mg Intravenous Daily   multivitamin with minerals  1 tablet Oral Daily   pantoprazole  40 mg Oral Daily   simvastatin  20 mg Oral Daily   traZODone  50 mg Oral QHS   Continuous Infusions:   LOS: 0 days    Time spent: 35 mins     Charise Killian, MD [facility] Pager [phone]  If 7PM-7AM, please contact night-coverage [email or web address] 08/01/2019, 8:35 AM

## 2019-08-01 NOTE — Progress Notes (Signed)
Physical Therapy Treatment Patient Details Name: Bruce [name] MRN: [id number] DOB: 1932-02-07 Today's Date: 08/01/2019    History of Present Illness 86 y.o. male with a history of dementia, hyperlipidemia, hypertension who presents today reevaluation regarding abnormal lung sounds and low blood pressure.  Patient has had multiple recent falls.     PT Comments    Pt continues to be very confused but was able to follow instructions better today and did surprisingly well participating with bed mobility, supine exercises (apart from lack of ROM/tone in b/l ankle lacking ~10 deg from neutral DF b/l) .  Pt continues to struggle to any standing attempts needing total assist just to keep weight forward off bed and unable to effectively shift weight onto walker, PT's hand, bed rail, etc to actually attain upright.  Pt, however, was able to maintain sitting balance at EOB w/o constant assist to keep from falling back.  Just 2 weeks ago pt was apparently able to wander around the home, down steps and into the garage/car, he is completely unable to even stand at this point. Wife present t/o session and shocked at how weak and limited he now is after just 1 week in the hospital.  Pt remains functionally quite limited, but was able to participate relatively well with exercises and bed mobility.  Follow Up Recommendations  SNF;Supervision/Assistance - 24 hour     Equipment Recommendations  None recommended by PT    Recommendations for Other Services       Precautions / Restrictions Precautions Precautions: Fall Restrictions Weight Bearing Restrictions: No    Mobility  Bed Mobility Overal bed mobility: Needs Assistance Bed Mobility: Supine to Sit;Sit to Supine     Supine to sit: Mod assist Sit to supine: Max assist   General bed mobility comments: Pt better at initiating movement with cuing today, but is still quite limited secondary profound confusion  Transfers Overall transfer level:  Needs assistance Equipment used: Rolling walker (2 wheeled);1 person hand held assist Transfers: Sit to/from Stand Sit to Stand: Max assist         General transfer comment: multiple attempts with walker as well as attempts with single HHA (with other hand on bed/rail).  Each attempt requried heavy, direct assist from PT to keep hips forward/off bed.  Even with very heavy assist he could not shift forward but tried multiple times to step/lift feet (R >L) but simply could not shift weight forward enough to attain standing despite very heavy assist.   Ambulation/Gait                 Stairs             Wheelchair Mobility    Modified Rankin (Stroke Patients Only)       Balance Overall balance assessment: Needs assistance Sitting-balance support: Bilateral upper extremity supported Sitting balance-Leahy Scale: Fair Sitting balance - Comments: Pt was able to maintain sitting much better this date, still leaning backward at times but not needing constant assist simply to stay upright   Standing balance support: Bilateral upper extremity supported;Single extremity supported Standing balance-Leahy Scale: Zero Standing balance comment: Pt unable to shift weight onto toes (ankles seem to lack functional DF to neutral), picking walker up when available, needing constant PT assist to keep from falling right back onto bed                            Cognition Arousal/Alertness: Awake/alert Behavior  During Therapy: Restless Overall Cognitive Status: History of cognitive impairments - at baseline                                        Exercises General Exercises - Lower Extremity Ankle Circles/Pumps: PROM;10 reps (minimal available ROM, DF stretch/ROM more than AP) Heel Slides: Strengthening;10 reps (with resisted leg extensions) Hip ABduction/ADduction: Strengthening;10 reps Straight Leg Raises: AROM;10 reps    General Comments         Pertinent Vitals/Pain Pain Assessment: No/denies pain    Home Living                      Prior Function            PT Goals (current goals can now be found in the care plan section) Progress towards PT goals: Progressing toward goals    Frequency    Min 2X/week      PT Plan Current plan remains appropriate    Co-evaluation              AM-PAC PT "6 Clicks" Mobility   Outcome Measure  Help needed turning from your back to your side while in a flat bed without using bedrails?: A Lot Help needed moving from lying on your back to sitting on the side of a flat bed without using bedrails?: A Lot Help needed moving to and from a bed to a chair (including a wheelchair)?: Total Help needed standing up from a chair using your arms (e.g., wheelchair or bedside chair)?: Total Help needed to walk in hospital room?: Total Help needed climbing 3-5 steps with a railing? : Total 6 Click Score: 8    End of Session Equipment Utilized During Treatment: Gait belt Activity Tolerance: Patient tolerated treatment well Patient left: in bed;with call bell/phone within reach;with family/visitor present;with nursing/sitter in room Nurse Communication: Mobility status PT Visit Diagnosis: Difficulty in walking, not elsewhere classified (R26.2);Muscle weakness (generalized) (M62.81);Repeated falls (R29.6)     Time: 0102-7253 PT Time Calculation (min) (ACUTE ONLY): 27 min  Charges:  $Therapeutic Exercise: 8-22 mins $Therapeutic Activity: 8-22 mins                     Malachi Pro, DPT 08/01/2019, 1:19 PM

## 2019-08-01 NOTE — Progress Notes (Signed)
*  PRELIMINARY RESULTS* Echocardiogram 2D Echocardiogram has been performed.  Bruce [name] 08/01/2019, 11:11 AM

## 2019-08-01 NOTE — Progress Notes (Signed)
Palliative: Mr. Colgate is sitting up in bed.  He looks in my direction but does not speak to me.  Present today at bedside is his wife, Isabelle Course, and her sister.  Mr. Doren has known memory loss, and I am unsure that he is able to make his basic needs known.  We talked about Mr. Neas's declines over the last few months, the last few weeks and days in particular.  Mrs. Cadenhead seems knowledgeable about the healthcare system and her options in caring for Mr. Beverely Pace.  She tells me that prior to this hospitalization she had been seeking private pay long-term placement.   We talked about Mr. Braniff's frailty, his poor functional status, physical therapy and speech therapy consults.  We also talked in detail about aspiration pneumonia risks.  I share my worries about Mr. Leppo's ability to take in adequate nutrition, his risk for aspiration, his risk for illness and injury being in a new environment.  Mrs. Carmicheal shares her concerns about the declines that she has seen since he has been hospitalized.  Mrs. Vestal shares her concern that her husband will not be able to walk any longer.  In particular we talked about nutritional status.  Mrs. Harkin continues to state that she would not want artificial feeding for her husband.  Conference with attending, bedside nursing staff, transition care team related to patient condition, needs, goals of care, disposition options  Plan:  Continue to treat the treatable but no CPR or intubation.  No tube feeding. Goal is short-term rehab with long-term care placement.  Mrs. Beltran tells me that her ultimate goal is to become a resident of [address] independent living, and then have her husband transferred to a skilled unit at [facility].  40 minutes  Lillia Carmel, NP Palliative medicine team Team phone [phone] Greater than 50% of this time was spent counseling and coordinating care related to the above assessment and plan.

## 2019-08-02 DIAGNOSIS — Z515 Encounter for palliative care: Secondary | ICD-10-CM

## 2019-08-02 DIAGNOSIS — Z7189 Other specified counseling: Secondary | ICD-10-CM

## 2019-08-02 DIAGNOSIS — E43 Unspecified severe protein-calorie malnutrition: Secondary | ICD-10-CM | POA: Insufficient documentation

## 2019-08-02 LAB — BASIC METABOLIC PANEL
Anion gap: 10 (ref 5–15)
BUN: 33 mg/dL — ABNORMAL HIGH (ref 8–23)
CO2: 25 mmol/L (ref 22–32)
Calcium: 9.5 mg/dL (ref 8.9–10.3)
Chloride: 103 mmol/L (ref 98–111)
Creatinine, Ser: 1.41 mg/dL — ABNORMAL HIGH (ref 0.61–1.24)
GFR calc Af Amer: 53 mL/min — ABNORMAL LOW (ref >60)
GFR calc non Af Amer: 45 mL/min — ABNORMAL LOW (ref >60)
Glucose, Bld: 149 mg/dL — ABNORMAL HIGH (ref 70–99)
Potassium: 4.2 mmol/L (ref 3.5–5.1)
Sodium: 138 mmol/L (ref 135–145)

## 2019-08-02 LAB — CBC
HCT: 47.8 % (ref 39.0–52.0)
Hemoglobin: 16.9 g/dL (ref 13.0–17.0)
MCH: 30.9 pg (ref 26.0–34.0)
MCHC: 35.4 g/dL (ref 30.0–36.0)
MCV: 87.4 fL (ref 80.0–100.0)
Platelets: 156 10*3/uL (ref 150–400)
RBC: 5.47 MIL/uL (ref 4.22–5.81)
RDW: 12.9 % (ref 11.5–15.5)
WBC: 10.7 10*3/uL — ABNORMAL HIGH (ref 4.0–10.5)
nRBC: 0 % (ref 0.0–0.2)

## 2019-08-02 LAB — GLUCOSE, CAPILLARY: Glucose-Capillary: 161 mg/dL — ABNORMAL HIGH (ref 70–99)

## 2019-08-02 MED ORDER — ACETAMINOPHEN 650 MG RE SUPP: 650.0000 mg | Freq: Four times a day (QID) | RECTAL | Status: DC | PRN

## 2019-08-02 MED ORDER — GLYCOPYRROLATE 1 MG PO TABS
1.0000 mg | ORAL_TABLET | ORAL | Status: DC | PRN
  Filled 2019-08-02: qty 1

## 2019-08-02 MED ORDER — ACETAMINOPHEN 325 MG PO TABS: 650.0000 mg | ORAL_TABLET | Freq: Four times a day (QID) | ORAL | Status: DC | PRN

## 2019-08-02 MED ORDER — MORPHINE SULFATE (CONCENTRATE) 10 MG/0.5ML PO SOLN
5.0000 mg | ORAL | Status: DC | PRN
  Administered 2019-08-05: 5 mg via ORAL
  Filled 2019-08-02: qty 0.5

## 2019-08-02 MED ORDER — LORAZEPAM 2 MG/ML IJ SOLN
1.0000 mg | INTRAMUSCULAR | Status: DC | PRN
  Administered 2019-08-03 – 2019-08-06 (×4): 1 mg via INTRAVENOUS
  Filled 2019-08-02 (×4): qty 1

## 2019-08-02 MED ORDER — MORPHINE SULFATE (CONCENTRATE) 10 MG/0.5ML PO SOLN
5.0000 mg | ORAL | Status: DC | PRN
  Administered 2019-08-03: 5 mg via SUBLINGUAL
  Filled 2019-08-02: qty 0.5

## 2019-08-02 MED ORDER — LORAZEPAM 1 MG PO TABS: 1.0000 mg | ORAL_TABLET | ORAL | Status: DC | PRN

## 2019-08-02 MED ORDER — GLYCOPYRROLATE 0.2 MG/ML IJ SOLN
0.2000 mg | INTRAMUSCULAR | Status: DC | PRN
  Filled 2019-08-02: qty 1

## 2019-08-02 MED ORDER — LORAZEPAM 2 MG/ML PO CONC
1.0000 mg | ORAL | Status: DC | PRN
  Filled 2019-08-02: qty 0.5

## 2019-08-02 NOTE — Progress Notes (Signed)
PT Cancellation Note  Patient Details Name: Bruce [name] MRN: [id number] DOB: [birth year]   Cancelled Treatment:     Per MD latest note, pt will be going comfort measures. PT  will sign off. Please re-consult if POC changes.    [name] 08/02/2019, 11:18 AM

## 2019-08-02 NOTE — Progress Notes (Signed)
PROGRESS NOTE    Bruce [name]  YSA:[id number] DOB: 12/01/1932 DOA: 07/26/2019 PCP: [name], MD    Brief Narrative:  86 year old gentleman with history of dementia, diabetes, hypertension hyperlipidemia, peripheral vascular disease brought to the emergency room with fall, chest pain and rash on the back. Unfortunately, patient is stayed in the ER for 5 days waiting for placement. Patient's wife was also looking forward to place him at assisted living facility and all work-up was underway. Underwent extensive investigations including brain MRI which was normal. Mildly elevated troponins with no evidence of active ischemia. Admitted to the hospital with multiple complaints. 7/21, lethargic aspirating, failure to thrive with severe protein calorie malnutrition, advanced dementia. Opted for comfort care and hospice.   Assessment & Plan:   Principal Problem:   Encounter for end of life care Active Problems:   Hyperlipidemia   Type II diabetes mellitus with renal manifestations (HCC)   Dementia without behavioral disturbance (HCC)   Fall   Chest pain   HTN (hypertension)   Rash   CKD (chronic kidney disease), stage IIIa   Elevated troponin   GERD (gastroesophageal reflux disease)   Depression   Pneumonia   Encounter for hospice care discussion   Protein-calorie malnutrition, severe  Encounter for end-of-life care Severe protein calorie malnutrition Advanced dementia and severe debility Stage III chronic kidney disease Hypertension Hyperlipidemia GERD  Plan: Patient progressively worsening, currently encephalopathic and without any meaningful chances of recovery. Detailed discussion with patient's wife and her sister at the bedside. Patient unable to keep up conversation. Family desired comfortable death. Comfort care measures, inpatient hospice referral, transfer when bed available No labs. No telemetry. RN to pronounce death. Encouraged close family members to  visit patient, patient's brother would like to visit. Liberalize visitor policy. Wrote for morphine sublingual and morphine IV Benzodiazepine Bowel regimen    DVT prophylaxis: Comfort care   Code Status: Comfort care Family Communication: Patient's wife and her sister at the bedside Disposition Plan: Status is: Inpatient  Remains inpatient appropriate because:Unsafe d/c plan and Needs inpatient hospice.   Dispo: The patient is from: Home              Anticipated d/c is to: Inpatient hospice              Anticipated d/c date is: 1 day              Patient currently is not medically stable to d/c. Is only able to be transferred to hospice level of care.         Consultants:   Palliative medicine  Cardiology  Procedures:   None  Antimicrobials:   None   Subjective: Patient seen and examined in the early morning rounds. Social worker and patient's family at the bedside. Overnight, patient had more lethargic, he is not able to open his mouth to eat. See above conversation, we decided to start him on comfort care measures.  Objective: Vitals:   08/01/19 1543 08/01/19 1936 08/02/19 0519 08/02/19 0750  BP: 110/64 105/66 104/66 121/64  Pulse: 82 72 63 63  Resp: 18 20 19 16   Temp:  98.3 F (36.8 C) 97.9 F (36.6 C) 98.5 F (36.9 C)  TempSrc:      SpO2: 94% 94% 92% 96%  Weight:      Height:        Intake/Output Summary (Last 24 hours) at 08/02/2019 1558 Last data filed at 08/02/2019 0028 Gross per 24 hour  Intake 120  ml  Output 0 ml  Net 120 ml   Filed Weights   07/26/19 1112 07/31/19 1429  Weight: 59 kg 58.6 kg    Examination:  Physical Exam Constitutional:      Comments: Sleepy, lethargic. Wakes up on stimuli. Follows simple commands.  Cardiovascular:     Rate and Rhythm: Regular rhythm.  Pulmonary:     Comments: Bilateral coarse breath sounds. Upper airway conducted sounds. Neurological:     Comments: Lethargic, sleepy, following simple  commands. Unable to keep up conversation or keep eyes open.       Data Reviewed: I have personally reviewed following labs and imaging studies  CBC: Recent Labs  Lab 07/29/19 0935 07/30/19 2135 08/01/19 0907 08/02/19 0544  WBC 6.2 8.5 8.8 10.7*  NEUTROABS 3.5 6.4  --   --   HGB 16.4 17.9* 16.5 16.9  HCT 47.5 51.3 46.3 47.8  MCV 87.6 86.9 85.9 87.4  PLT 147* 152 150 156   Basic Metabolic Panel: Recent Labs  Lab 07/29/19 0935 07/30/19 2135 08/01/19 0907 08/02/19 0544  NA 132* 135 136 138  K 4.1 4.5 4.2 4.2  CL 96* 101 104 103  CO2 23 23 23 25   GLUCOSE 136* 143* 149* 149*  BUN 20 25* 27* 33*  CREATININE 1.16 1.30* 1.09 1.41*  CALCIUM 9.3 9.6 9.3 9.5   GFR: Estimated Creatinine Clearance: 32.3 mL/min (A) (by C-G formula based on SCr of 1.41 mg/dL (H)). Liver Function Tests: Recent Labs  Lab 07/29/19 0935  AST 34  ALT 25  ALKPHOS 54  BILITOT 0.8  PROT 7.0  ALBUMIN 4.2   No results for input(s): LIPASE, AMYLASE in the last 168 hours. No results for input(s): AMMONIA in the last 168 hours. Coagulation Profile: No results for input(s): INR, PROTIME in the last 168 hours. Cardiac Enzymes: No results for input(s): CKTOTAL, CKMB, CKMBINDEX, TROPONINI in the last 168 hours. BNP (last 3 results) No results for input(s): PROBNP in the last 8760 hours. HbA1C: Recent Labs    07/30/19 2135  HGBA1C 6.9*   CBG: Recent Labs  Lab 08/01/19 0725 08/01/19 1125 08/01/19 1648 08/01/19 2104 08/02/19 0754  GLUCAP 146* 192* 239* 165* 161*   Lipid Profile: Recent Labs    08/01/19 0524  CHOL 125  HDL 47  LDLCALC 65  TRIG 66  CHOLHDL 2.7   Thyroid Function Tests: No results for input(s): TSH, T4TOTAL, FREET4, T3FREE, THYROIDAB in the last 72 hours. Anemia Panel: No results for input(s): VITAMINB12, FOLATE, FERRITIN, TIBC, IRON, RETICCTPCT in the last 72 hours. Sepsis Labs: No results for input(s): PROCALCITON, LATICACIDVEN in the last 168 hours.  Recent  Results (from the past 240 hour(s))  SARS Coronavirus 2 by RT PCR (hospital order, performed in [facility] hospital lab) Nasopharyngeal Nasopharyngeal Swab     Status: None   Collection Time: 07/29/19 12:11 PM   Specimen: Nasopharyngeal Swab  Result Value Ref Range Status   SARS Coronavirus 2 NEGATIVE NEGATIVE Final    Comment: (NOTE) SARS-CoV-2 target nucleic acids are NOT DETECTED.  The SARS-CoV-2 RNA is generally detectable in upper and lower respiratory specimens during the acute phase of infection. The lowest concentration of SARS-CoV-2 viral copies this assay can detect is 250 copies / mL. A negative result does not preclude SARS-CoV-2 infection and should not be used as the sole basis for treatment or other patient management decisions.  A negative result may occur with improper specimen collection / handling, submission of specimen other than nasopharyngeal  swab, presence of viral mutation(s) within the areas targeted by this assay, and inadequate number of viral copies (<250 copies / mL). A negative result must be combined with clinical observations, patient history, and epidemiological information.  Fact Sheet for Patients:   BoilerBrush.com.cy  Fact Sheet for Healthcare Providers: https://pope.com/  This test is not yet approved or  cleared by the [place] FDA and has been authorized for detection and/or diagnosis of SARS-CoV-2 by FDA under an Emergency Use Authorization (EUA).  This EUA will remain in effect (meaning this test can be used) for the duration of the COVID-19 declaration under Section 564(b)(1) of the Act, 21 U.S.C. section 360bbb-3(b)(1), unless the authorization is terminated or revoked sooner.  Performed at [facility], [address], [place], [place] [address]          Radiology Studies: ECHOCARDIOGRAM COMPLETE  Result Date: 08/01/2019    ECHOCARDIOGRAM REPORT   Patient Name:    Bruce [name] Date of Exam: 08/01/2019 Medical Rec #:  [id number]       Height:       66.0 in Accession #:    [id number]      Weight:       129.1 lb Date of Birth:  1932/10/11      BSA:          1.660 m Patient Age:    86 years        BP:           107/68 mmHg Patient Gender: M               HR:           54 bpm. Exam Location:  [facility] Procedure: 2D Echo, Color Doppler and Cardiac Doppler Indications:     I21.4 NSTEMI  History:         Patient has no prior history of Echocardiogram examinations.                  Risk Factors:Hypertension and Dyslipidemia. Alzheimer's                  disease.  Sonographer:     Humphrey Rolls RDCS (AE) Referring Phys:  [id number] Lamar Blinks Diagnosing Phys: Arnoldo Hooker MD  Sonographer Comments: Technically challenging study due to limited acoustic windows. Image acquisition challenging due to uncooperative patient. IMPRESSIONS  1. Left ventricular ejection fraction, by estimation, is 45 to 50%. The left ventricle has mildly decreased function. The left ventricle has no regional wall motion abnormalities. Left ventricular diastolic parameters were normal.  2. Right ventricular systolic function is normal. The right ventricular size is normal.  3. The mitral valve is grossly normal. Trivial mitral valve regurgitation.  4. The aortic valve is grossly normal. Aortic valve regurgitation is not visualized. FINDINGS  Left Ventricle: Left ventricular ejection fraction, by estimation, is 45 to 50%. The left ventricle has mildly decreased function. The left ventricle has no regional wall motion abnormalities. The left ventricular internal cavity size was small. There is no left ventricular hypertrophy. Left ventricular diastolic parameters were normal. Right Ventricle: The right ventricular size is normal. No increase in right ventricular wall thickness. Right ventricular systolic function is normal. Left Atrium: Left atrial size was normal in size. Right Atrium: Right atrial size was normal  in size. Pericardium: There is no evidence of pericardial effusion. Mitral Valve: The mitral valve is grossly normal. Trivial mitral valve regurgitation. MV peak gradient, 3.2 mmHg. The mean mitral  valve gradient is 1.0 mmHg. Tricuspid Valve: The tricuspid valve is grossly normal. Tricuspid valve regurgitation is trivial. Aortic Valve: The aortic valve is grossly normal. Aortic valve regurgitation is not visualized. Aortic valve mean gradient measures 5.0 mmHg. Aortic valve peak gradient measures 8.4 mmHg. Aortic valve area, by VTI measures 1.99 cm. Pulmonic Valve: The pulmonic valve was normal in structure. Pulmonic valve regurgitation is not visualized. Aorta: The aortic root and ascending aorta are structurally normal, with no evidence of dilitation. IAS/Shunts: No atrial level shunt detected by color flow Doppler.  LEFT VENTRICLE PLAX 2D LVOT diam:     1.70 cm     Diastology LV SV:         59          LV e' lateral:   7.83 cm/s LV SV Index:   35          LV E/e' lateral: 7.6 LVOT Area:     2.27 cm    LV e' medial:    5.55 cm/s                            LV E/e' medial:  10.7  LV Volumes (MOD) LV vol d, MOD A4C: 42.8 ml LV vol s, MOD A4C: 16.8 ml LV SV MOD A4C:     42.8 ml LEFT ATRIUM           Index LA Vol (A4C): 21.7 ml 13.07 ml/m  AORTIC VALVE AV Area (Vmax):    1.86 cm AV Area (Vmean):   1.70 cm AV Area (VTI):     1.99 cm AV Vmax:           145.00 cm/s AV Vmean:          106.000 cm/s AV VTI:            0.296 m AV Peak Grad:      8.4 mmHg AV Mean Grad:      5.0 mmHg LVOT Vmax:         119.00 cm/s LVOT Vmean:        79.400 cm/s LVOT VTI:          0.259 m LVOT/AV VTI ratio: 0.88 MITRAL VALVE MV Area (PHT): 3.21 cm    SHUNTS MV Peak grad:  3.2 mmHg    Systemic VTI:  0.26 m MV Mean grad:  1.0 mmHg    Systemic Diam: 1.70 cm MV Vmax:       0.90 m/s MV Vmean:      34.2 cm/s MV Decel Time: 236 msec MV E velocity: 59.20 cm/s MV A velocity: 64.50 cm/s MV E/A ratio:  0.92 Arnoldo HookerBruce Kowalski MD Electronically signed by  Arnoldo HookerBruce Kowalski MD Signature Date/Time: 08/01/2019/12:50:28 PM    Final         Scheduled Meds: Continuous Infusions:   LOS: 1 day    Time spent: 35 minutes    Dorcas CarrowKuber Daleyssa Loiselle, MD [facility] Pager [phone]

## 2019-08-02 NOTE — Progress Notes (Signed)
SLP Cancellation Note  Patient Details Name: Bruce [name] MRN: [id number] DOB: Aug 22, 1932   Cancelled treatment:       Reason Eval/Treat Not Completed:  (chart reviewed; consulted NSG). Pt has transitioned to comfort care w/ Hospice services. Recommend aspiration precautions w/ any po's; oral care for comfort. ST services can be available if any further needs while admitted.     Jerilynn Som, MS, CCC-SLP [name] 08/02/2019, 2:18 PM

## 2019-08-02 NOTE — Progress Notes (Signed)
Palliative: Mr. Zaring is lying quietly in bed.  He will briefly make but not keep eye contact.  He has worsened overnight.  He has known dementia, that seems to be worsening at a rapid pace.  He is no longer able to raise his head from the pillow, cannot form a sentence.  He has been eating less over the last week, and declines any food or liquid today.  I believe he is silently aspirating.  He is unable to manage the secretions in his mouth, is drooling, and sounds quite congested.  His wife of 63 years, Sufian Ravi is at bedside along with her sister.  Mrs. Housholder shares that Mr. Mounts has worsened overnight.  They share that he will briefly interact with them, but quickly goes to sleep.  They share that he has declined any food or drink today.  She shares Mr. Chenault decline over the last few months, the last week in particular.  I shared that I have spoken with the transition of care team and it seems that they are ready for hospice care.  Mrs. Swaim shares that they have been active with outpatient palliative services with [facility] for quite some time.  Mrs. Hari shares that indeed, they are ready for residential hospice care.  We talk in detail about what is and is not provided with hospice care.  Mrs. Mcpartlin shares that they are familiar with hospice type care.  Mrs. Burkhead is tearful as we talked about what a loving husband and good provider Mr. Desantis has been for them.  They are requesting comfort and dignity at end-of-life, residential hospice with [facility] location.  Mrs. Noah and her sister state that they understand that only medications focused on comfort will be given, the idea is to "let nature take its course".  Conference with attending, bedside nursing staff, transition care team, hospice liaison related to patient condition, needs.  Goals of care, request for residential hospice, comfort and dignity at end-of-life.  Plan: Family is requesting comfort and  dignity at end-of-life, residential hospice with [facility] to "let nature take its course".  Prognosis: 2 weeks or less would be be expected based on Mr. Kiss quick functional, mental, and nutritional decline over the last 7 days, family's desire to focus on comfort and dignity only, poor to none by mouth intake.  PPS 6 months ago - 60% PPS 1 month ago   - 40% PPS today              - 10-20%   Weight trends:    07/31/19 - 58.6 kg  06/13/19 - 61.1 kg 03/24/19 - 63 kg   55 minutes Lillia Carmel, NP Palliative medicine team Team phone [phone] risk with history [phone] Greater than 50% of this time was spent counseling and coordinating care related to the above assessment and plan.

## 2019-08-02 NOTE — Progress Notes (Addendum)
[facility] Room 252 [facility]) Hospital Liaison RN note:  Received request from Lillia Carmel, [place] for family interest in Hospice Home. Anthonette Legato, TOC is aware. Chart is in review and is approved for Hospice Home eligibility.  Spoke with spouse, Isabelle Course, in the room to acknowledge referral and initiate education related to hospice philosophy and services provided. She verbalized understanding and had no questions.  Unfortunately, Hospice Home is not able to offer a room today. Hospice team and Isabelle Course are aware. [facility] Liaison will follow and notify when bed is available.  Please call with any hospice related questions or concerns.  Thank you for the opportunity to participate in this patient's care.  Cyndra Numbers, RN [facility] Liaison [phone]

## 2019-08-02 NOTE — TOC Transition Note (Signed)
Transition of Care [facility]) - CM/SW Discharge Note   Patient Details  Name: Bruce [name] MRN: [id number] Date of Birth: [birth year]  Transition of Care [facility]) CM/SW Contact:  Eilleen Kempf, LCSW Phone Number: 08/02/2019, 3:55 PM   Clinical Narrative:    7/21: Treatment team agreed patient is now appropriate for inpatient hospice. He is awaiting a bed.    Final next level of care: Skilled Nursing Facility Barriers to Discharge: Continued Medical Work up, SNF Pending bed offer   Patient Goals and CMS Choice Patient states their goals for this hospitalization and ongoing recovery are:: Pt's wife is requesting skills nursing facility for this patient. CMS Medicare.gov Compare Post Acute Care list provided to:: Patient Represenative (must comment) (Pt's wife) Choice offered to / list presented to : Spouse  Discharge Placement                       Discharge Plan and Services In-house Referral: Clinical Social Work, Orthoptist, Hospice / Palliative Care                                   Social Determinants of Health (SDOH) Interventions     Readmission Risk Interventions No flowsheet data found.

## 2019-08-02 NOTE — Progress Notes (Signed)
Detail notes to follow  Patient aspirating and encephalopathic, appropriate for hospice  Wife and her sister at the bedside  Plan Comfort care measures, Inpatient hospice referral Comfort meds No labs Dc tele RN to pronounce death Liberalize visitor policy

## 2019-08-03 DIAGNOSIS — E43 Unspecified severe protein-calorie malnutrition: Secondary | ICD-10-CM

## 2019-08-03 MED ORDER — HYDROCORTISONE 1 % EX LOTN
TOPICAL_LOTION | Freq: Two times a day (BID) | CUTANEOUS | Status: DC
  Administered 2019-08-05: 1 via TOPICAL
  Filled 2019-08-03 (×2): qty 118

## 2019-08-03 NOTE — Progress Notes (Signed)
[facility] Room 252 [facility]) Hospital Liaison RN note:  Visited with patient and spouse, Isabelle Course in the room. Patient is resting comfortably and responds verbally to RN questions. Isabelle Course has no questions or concerns at present.  Unfortunately, Hospice home does not have a bed available to offer today. Hospital care team is aware. [facility] Liaison will continue to follow for bed availability.    Please call with any hospice related questions or concerns.  Thank you for the opportunity to participate in this patient's care.  Cyndra Numbers, RN [facility] Liaison [phone]

## 2019-08-03 NOTE — Progress Notes (Signed)
PROGRESS NOTE    Bruce [name]  CXK:[id number] DOB: November 16, 1932 DOA: 07/26/2019 PCP: Bruce Arbour, MD    Brief Narrative:  86 year old gentleman with history of dementia, diabetes, hypertension hyperlipidemia, peripheral vascular disease brought to the emergency room with fall, chest pain and rash on the back. Unfortunately, patient is stayed in the ER for 5 days waiting for placement. Patient's wife was also looking forward to place him at assisted living facility and all work-up was underway. Underwent extensive investigations including brain MRI which was normal. Mildly elevated troponins with no evidence of active ischemia. Admitted to the hospital with multiple complaints. 7/21, lethargic aspirating, failure to thrive with severe protein calorie malnutrition, advanced dementia. Opted for comfort care and hospice. 7/22, remains on comfort care measures.  Waiting for inpatient hospice bed.   Assessment & Plan:   Principal Problem:   Encounter for end of life care Active Problems:   Hyperlipidemia   Type II diabetes mellitus with renal manifestations (HCC)   Dementia without behavioral disturbance (HCC)   Fall   Chest pain   HTN (hypertension)   Rash   CKD (chronic kidney disease), stage IIIa   Elevated troponin   GERD (gastroesophageal reflux disease)   Depression   Pneumonia   Encounter for hospice care discussion   Protein-calorie malnutrition, severe  Encounter for end-of-life care Severe protein calorie malnutrition Advanced dementia and severe debility Stage III chronic kidney disease Hypertension Hyperlipidemia GERD  Plan: Patient progressively worsening, currently encephalopathic and without any meaningful chances of recovery. Family desired comfortable death. Comfort care measures, inpatient hospice referral, transfer when bed available No labs. No telemetry. RN to pronounce death. Wrote for morphine sublingual and morphine IV Benzodiazepine Bowel  regimen Transfer to inpatient hospice when bed is available.  He may pass away in the hospital waiting for bed.    DVT prophylaxis: Comfort care   Code Status: Comfort care Family Communication: Patient's wife Bruce [name] and brother at the bedside. Disposition Plan: Status is: Inpatient  Remains inpatient appropriate because:Unsafe d/c plan and Needs inpatient hospice.   Dispo: The patient is from: Home              Anticipated d/c is to: Inpatient hospice              Anticipated d/c date is: 1 day              Patient currently is not medically stable to d/c. Is only able to be transferred to hospice level of care.         Consultants:   Palliative medicine  Cardiology  Procedures:   None  Antimicrobials:   None   Subjective: Patient seen and examined.  He was sleepy, on his stimulation he woke up with unintelligible speech and gurgly voice.  Remains comfortable.  No overnight events.  Objective: Vitals:   08/02/19 0519 08/02/19 0750 08/02/19 2055 08/03/19 0721  BP: 104/66 121/64 130/72 (!) 158/82  Pulse: 63 63  91  Resp: 19 16  18   Temp: 97.9 F (36.6 C) 98.5 F (36.9 C) 98.6 F (37 C) 98.2 F (36.8 C)  TempSrc:   Oral Oral  SpO2: 92% 96% 100% 100%  Weight:      Height:        Intake/Output Summary (Last 24 hours) at 08/03/2019 1132 Last data filed at 08/03/2019 1020 Gross per 24 hour  Intake 120 ml  Output 200 ml  Net -80 ml   08/05/2019  07/26/19 1112 07/31/19 1429  Weight: 59 kg 58.6 kg    Examination:  Physical Exam Constitutional:      Comments: Sleepy, lethargic, unintelligible speech.  Sick looking.  Currently not in any distress.  Cardiovascular:     Rate and Rhythm: Regular rhythm.  Pulmonary:     Comments: Bilateral coarse breath sounds. Upper airway conducted sounds. Neurological:     Comments: Lethargic, sleepy, does not follow any commands.  Unable to keep up conversation or keep eyes open.       Data  Reviewed: I have personally reviewed following labs and imaging studies  CBC: Recent Labs  Lab 07/29/19 0935 07/30/19 2135 08/01/19 0907 08/02/19 0544  WBC 6.2 8.5 8.8 10.7*  NEUTROABS 3.5 6.4  --   --   HGB 16.4 17.9* 16.5 16.9  HCT 47.5 51.3 46.3 47.8  MCV 87.6 86.9 85.9 87.4  PLT 147* 152 150 156   Basic Metabolic Panel: Recent Labs  Lab 07/29/19 0935 07/30/19 2135 08/01/19 0907 08/02/19 0544  NA 132* 135 136 138  K 4.1 4.5 4.2 4.2  CL 96* 101 104 103  CO2 23 23 23 25   GLUCOSE 136* 143* 149* 149*  BUN 20 25* 27* 33*  CREATININE 1.16 1.30* 1.09 1.41*  CALCIUM 9.3 9.6 9.3 9.5   GFR: Estimated Creatinine Clearance: 32.3 mL/min (A) (by C-G formula based on SCr of 1.41 mg/dL (H)). Liver Function Tests: Recent Labs  Lab 07/29/19 0935  AST 34  ALT 25  ALKPHOS 54  BILITOT 0.8  PROT 7.0  ALBUMIN 4.2   No results for input(s): LIPASE, AMYLASE in the last 168 hours. No results for input(s): AMMONIA in the last 168 hours. Coagulation Profile: No results for input(s): INR, PROTIME in the last 168 hours. Cardiac Enzymes: No results for input(s): CKTOTAL, CKMB, CKMBINDEX, TROPONINI in the last 168 hours. BNP (last 3 results) No results for input(s): PROBNP in the last 8760 hours. HbA1C: No results for input(s): HGBA1C in the last 72 hours. CBG: Recent Labs  Lab 08/01/19 0725 08/01/19 1125 08/01/19 1648 08/01/19 2104 08/02/19 0754  GLUCAP 146* 192* 239* 165* 161*   Lipid Profile: Recent Labs    08/01/19 0524  CHOL 125  HDL 47  LDLCALC 65  TRIG 66  CHOLHDL 2.7   Thyroid Function Tests: No results for input(s): TSH, T4TOTAL, FREET4, T3FREE, THYROIDAB in the last 72 hours. Anemia Panel: No results for input(s): VITAMINB12, FOLATE, FERRITIN, TIBC, IRON, RETICCTPCT in the last 72 hours. Sepsis Labs: No results for input(s): PROCALCITON, LATICACIDVEN in the last 168 hours.  Recent Results (from the past 240 hour(s))  SARS Coronavirus 2 by RT PCR  (hospital order, performed in [facility] hospital lab) Nasopharyngeal Nasopharyngeal Swab     Status: None   Collection Time: 07/29/19 12:11 PM   Specimen: Nasopharyngeal Swab  Result Value Ref Range Status   SARS Coronavirus 2 NEGATIVE NEGATIVE Final    Comment: (NOTE) SARS-CoV-2 target nucleic acids are NOT DETECTED.  The SARS-CoV-2 RNA is generally detectable in upper and lower respiratory specimens during the acute phase of infection. The lowest concentration of SARS-CoV-2 viral copies this assay can detect is 250 copies / mL. A negative result does not preclude SARS-CoV-2 infection and should not be used as the sole basis for treatment or other patient management decisions.  A negative result may occur with improper specimen collection / handling, submission of specimen other than nasopharyngeal swab, presence of viral mutation(s) within the areas targeted by  this assay, and inadequate number of viral copies (<250 copies / mL). A negative result must be combined with clinical observations, patient history, and epidemiological information.  Fact Sheet for Patients:   BoilerBrush.com.cy  Fact Sheet for Healthcare Providers: https://pope.com/  This test is not yet approved or  cleared by the [place] FDA and has been authorized for detection and/or diagnosis of SARS-CoV-2 by FDA under an Emergency Use Authorization (EUA).  This EUA will remain in effect (meaning this test can be used) for the duration of the COVID-19 declaration under Section 564(b)(1) of the Act, 21 U.S.C. section 360bbb-3(b)(1), unless the authorization is terminated or revoked sooner.  Performed at [facility], [address] [place], [place] [address]          Radiology Studies: No results found.      Scheduled Meds: Continuous Infusions:   LOS: 2 days    Time spent: 30 minutes    Dorcas Carrow, MD [facility] Pager [phone]

## 2019-08-03 NOTE — Progress Notes (Signed)
Patient resting in the bed at this time, remains on comfort care , family at bedside, no distress noted .

## 2019-08-03 NOTE — Progress Notes (Signed)
Palliative: Mr. Langenbach is lying quietly in bed.  He is asleep and does not respond when I gently call his name.  His brother is at bedside.  Mr. Tatsch brother and I talked about symptom management.  We also talked about unburdening Mr. Koller from cardiac telemetry, and safety mitts while he is present.  Mr. Stooksbury family is requesting comfort and dignity at end-of-life, residential hospice with [facility].  He is awaiting transfer to hospice home.  He appears relatively stable and comfortable.  Conference with attending, bedside nursing staff, transition of care team related to patient condition, needs, goals of care, symptom management, residential hospice placement.  Plan: Requesting comfort and dignity at end-of-life, let nature take its course at residential hospice with [facility].   25 minutes Lillia Carmel, NP Palliative medicine team Team phone [phone] Greater than 50% of this time was spent counseling and coordinating care related to the above assessment and plan.

## 2019-08-04 NOTE — Progress Notes (Signed)
PROGRESS NOTE    Bruce [name]  BJS:[id number] DOB: [birth year] DOA: 07/26/2019 PCP: Bruce Arbour, MD    Brief Narrative:  86 year old gentleman with history of dementia, diabetes, hypertension hyperlipidemia, peripheral vascular disease brought to the emergency room with fall, chest pain and rash on the back. Unfortunately, patient  stayed in the ER for 5 days waiting for placement. Patient's wife was also looking forward to place him at assisted living facility and all work-up was underway. Underwent extensive investigations including brain MRI which was normal. Mildly elevated troponins with no evidence of active ischemia. Admitted to the hospital with multiple complaints. 7/21, lethargic aspirating, failure to thrive with severe protein calorie malnutrition, advanced dementia. Opted for comfort care and hospice. 7/22, remains on comfort care measures.  Waiting for inpatient hospice bed. 7/23, no inpatient hospice bed available.  We are providing him with end-of-life care in the hospital.   Assessment & Plan:   Principal Problem:   Encounter for end of life care Active Problems:   Hyperlipidemia   Type II diabetes mellitus with renal manifestations (HCC)   Dementia without behavioral disturbance (HCC)   Fall   Chest pain   HTN (hypertension)   Rash   CKD (chronic kidney disease), stage IIIa   Elevated troponin   GERD (gastroesophageal reflux disease)   Depression   Pneumonia   Encounter for hospice care discussion   Protein-calorie malnutrition, severe  Encounter for end-of-life care Severe protein calorie malnutrition Advanced dementia and severe debility Stage III chronic kidney disease Hypertension Hyperlipidemia GERD  Plan: Patient progressively worsening, currently encephalopathic and without any meaningful chances of recovery. Family desired comfortable death. Comfort care measures, inpatient hospice referral, transfer when bed available No labs. No  telemetry. RN to pronounce death. Wrote for morphine sublingual and morphine IV Benzodiazepine Bowel regimen Transfer to inpatient hospice when bed is available.  He may pass away in the hospital waiting for bed.  DVT prophylaxis: Comfort care   Code Status: Comfort care Family Communication: Patient's wife Bruce [name] at the bedside. Disposition Plan: Status is: Inpatient  Remains inpatient appropriate because:Unsafe d/c plan and Needs inpatient hospice.   Dispo: The patient is from: Home              Anticipated d/c is to: Inpatient hospice              Anticipated d/c date is: 1 day              Patient currently is not medically stable to d/c. Is only able to be transferred to hospice level of care.         Consultants:   Palliative medicine  Cardiology  Procedures:   None  Antimicrobials:   None   Subjective: Seen and examined.  Obtunded and not responding.  Wife at the bedside.  He looks comfortable. Counseling and support provided to the patient's wife, she is accepting about patient's condition and is expecting him to pass away peacefully.  Objective: Vitals:   08/02/19 2055 08/03/19 0721 08/03/19 2141 08/04/19 0720  BP: 130/72 (!) 158/82 (!) 121/56 108/69  Pulse:  91 60 59  Resp:  18 21 17   Temp: 98.6 F (37 C) 98.2 F (36.8 C) 98.4 F (36.9 C) 98.4 F (36.9 C)  TempSrc: Oral Oral Axillary Axillary  SpO2: 100% 100% 95% 93%  Weight:   58.1 kg   Height:        Intake/Output Summary (Last 24 hours) at 08/04/2019 1357  Last data filed at 08/04/2019 0957 Gross per 24 hour  Intake 30 ml  Output 400 ml  Net -370 ml   Filed Weights   07/26/19 1112 07/31/19 1429 08/03/19 2141  Weight: 59 kg 58.6 kg 58.1 kg    Examination:  Physical Exam Constitutional:      Comments: Sleepy.  Not responding.  Looks comfortable.  HENT:     Head: Normocephalic.  Pulmonary:     Comments: Breathing comfortably.  On room air. Neurological:     Comments:  Lethargic, sleepy, does not follow any commands.         Data Reviewed: I have personally reviewed following labs and imaging studies  CBC: Recent Labs  Lab 07/29/19 0935 07/30/19 2135 08/01/19 0907 08/02/19 0544  WBC 6.2 8.5 8.8 10.7*  NEUTROABS 3.5 6.4  --   --   HGB 16.4 17.9* 16.5 16.9  HCT 47.5 51.3 46.3 47.8  MCV 87.6 86.9 85.9 87.4  PLT 147* 152 150 156   Basic Metabolic Panel: Recent Labs  Lab 07/29/19 0935 07/30/19 2135 08/01/19 0907 08/02/19 0544  NA 132* 135 136 138  K 4.1 4.5 4.2 4.2  CL 96* 101 104 103  CO2 23 23 23 25   GLUCOSE 136* 143* 149* 149*  BUN 20 25* 27* 33*  CREATININE 1.16 1.30* 1.09 1.41*  CALCIUM 9.3 9.6 9.3 9.5   GFR: Estimated Creatinine Clearance: 32 mL/min (A) (by C-G formula based on SCr of 1.41 mg/dL (H)). Liver Function Tests: Recent Labs  Lab 07/29/19 0935  AST 34  ALT 25  ALKPHOS 54  BILITOT 0.8  PROT 7.0  ALBUMIN 4.2   No results for input(s): LIPASE, AMYLASE in the last 168 hours. No results for input(s): AMMONIA in the last 168 hours. Coagulation Profile: No results for input(s): INR, PROTIME in the last 168 hours. Cardiac Enzymes: No results for input(s): CKTOTAL, CKMB, CKMBINDEX, TROPONINI in the last 168 hours. BNP (last 3 results) No results for input(s): PROBNP in the last 8760 hours. HbA1C: No results for input(s): HGBA1C in the last 72 hours. CBG: Recent Labs  Lab 08/01/19 0725 08/01/19 1125 08/01/19 1648 08/01/19 2104 08/02/19 0754  GLUCAP 146* 192* 239* 165* 161*   Lipid Profile: No results for input(s): CHOL, HDL, LDLCALC, TRIG, CHOLHDL, LDLDIRECT in the last 72 hours. Thyroid Function Tests: No results for input(s): TSH, T4TOTAL, FREET4, T3FREE, THYROIDAB in the last 72 hours. Anemia Panel: No results for input(s): VITAMINB12, FOLATE, FERRITIN, TIBC, IRON, RETICCTPCT in the last 72 hours. Sepsis Labs: No results for input(s): PROCALCITON, LATICACIDVEN in the last 168 hours.  Recent Results  (from the past 240 hour(s))  SARS Coronavirus 2 by RT PCR (hospital order, performed in [facility] hospital lab) Nasopharyngeal Nasopharyngeal Swab     Status: None   Collection Time: 07/29/19 12:11 PM   Specimen: Nasopharyngeal Swab  Result Value Ref Range Status   SARS Coronavirus 2 NEGATIVE NEGATIVE Final    Comment: (NOTE) SARS-CoV-2 target nucleic acids are NOT DETECTED.  The SARS-CoV-2 RNA is generally detectable in upper and lower respiratory specimens during the acute phase of infection. The lowest concentration of SARS-CoV-2 viral copies this assay can detect is 250 copies / mL. A negative result does not preclude SARS-CoV-2 infection and should not be used as the sole basis for treatment or other patient management decisions.  A negative result may occur with improper specimen collection / handling, submission of specimen other than nasopharyngeal swab, presence of viral mutation(s) within  the areas targeted by this assay, and inadequate number of viral copies (<250 copies / mL). A negative result must be combined with clinical observations, patient history, and epidemiological information.  Fact Sheet for Patients:   BoilerBrush.com.cy  Fact Sheet for Healthcare Providers: https://pope.com/  This test is not yet approved or  cleared by the [place] FDA and has been authorized for detection and/or diagnosis of SARS-CoV-2 by FDA under an Emergency Use Authorization (EUA).  This EUA will remain in effect (meaning this test can be used) for the duration of the COVID-19 declaration under Section 564(b)(1) of the Act, 21 U.S.C. section 360bbb-3(b)(1), unless the authorization is terminated or revoked sooner.  Performed at [facility], [address] [place], [place] [address]          Radiology Studies: No results found.      Scheduled Meds: . hydrocortisone   Topical BID   Continuous  Infusions:   LOS: 3 days    Time spent: 30 minutes    Dorcas Carrow, MD [facility] Pager [phone]

## 2019-08-04 NOTE — Progress Notes (Signed)
[facility] Room 252 [facility] Liaison RN note:  Visited with patient and his brother, Cyndra Numbers in the room. Patient is resting comfortably. Spoke with spouse, Isabelle Course on the phone to let her know that Hospice Home does not have a bed available today to offer. Hospital care team is aware.  [facility] Liaison will continue to follow for bed availability.  Please call with any hospice related question or concerns.  Thank you for the opportunity to participate in this patient's care.  Cyndra Numbers, RN [facility] Liaison [phone]

## 2019-08-04 NOTE — Care Management Important Message (Signed)
Important Message  Patient Details  Name: Bruce [name] MRN: [id number] Date of Birth: September 13, 1932   Medicare Important Message Given:  Yes     [name] [name] 08/04/2019, 11:13 AM

## 2019-08-05 NOTE — Progress Notes (Signed)
Patient arrived  From 2A to Room 151-A

## 2019-08-05 NOTE — Progress Notes (Signed)
Patient communicates that he wants to be left alone by nodding head after drink offered to patient.

## 2019-08-05 NOTE — Progress Notes (Signed)
PROGRESS NOTE  Bruce [name]  DOB: 03/07/1932  PCP: [name], MD WVP:[id number]  DOA: 07/26/2019  LOS: 4 days   Chief Complaint  Patient presents with  . Hypotension   Brief narrative: Patient is an 86 year old gentleman with history of dementia, diabetes, hypertension hyperlipidemia, peripheral vascular disease. He was brought to the emergency room on 7/14 with fall, chest pain and rash on the back. Unfortunately, patient  stayed in the ER for 5 days waiting for placement. Patient's wife was also looking forward to place him at assisted living facility and all work-up was underway. Underwent extensive investigations including brain MRI which was normal. Mildly elevated troponins with no evidence of active ischemia.  Admitted to the hospital with multiple complaints.  On 7/21, patient was noted to be lethargic, had failure to thrive with severe protein calorie malnutrition, advanced dementia.  Patient and family opted for comfort care and hospice. Pending bed availability at residential hospice.  Subjective: Patient was seen and examined this morning.  Sleeping, comfortable.  I did not wake him up.  Family was at bedside and did not want to disturb.  Family did not have any question at this time.  Pending bed availability at residential hospice..  Assessment: Encounter for end-of-life care Severe protein calorie malnutrition Advanced dementia and severe debility Stage III chronic kidney disease Hypertension Hyperlipidemia GERD  Plan:  Patient is progressively worsening, currently encephalopathic and without any meaningful chances of recovery. Family desired comfort measures Comfort care measures initiated No labs. No telemetry. RN to pronounce death. Wrote for morphine sublingual and morphine IV Benzodiazepine Bowel regimen Transfer to inpatient hospice when bed is available.  He may pass away in the hospital waiting for bed.  Code Status:   Code Status: DNR    Nutritional status: Body mass index is 20.67 kg/m. Nutrition Problem: Severe Malnutrition Etiology: social / environmental circumstances (dementia, advanced age) Signs/Symptoms: moderate fat depletion, severe fat depletion, severe muscle depletion Diet Order            Diet general           DIET - DYS 1 Room service appropriate? No; Fluid consistency: Nectar Thick  Diet effective now                 Family Communication:  Family at bedside.  Status is: Inpatient  Remains inpatient appropriate because pending availability at residential hospice.  Dispo:  Patient From: Home  Planned Disposition: Residential Hospice  Expected discharge date: Whenever bed is available.  Infusions:    Scheduled Meds: . hydrocortisone   Topical BID    Antimicrobials: Anti-infectives (From admission, onward)   None      PRN meds: acetaminophen **OR** acetaminophen, acetaminophen, diphenhydrAMINE, glycopyrrolate **OR** glycopyrrolate **OR** glycopyrrolate, LORazepam **OR** LORazepam **OR** LORazepam, morphine injection, morphine CONCENTRATE **OR** morphine CONCENTRATE, nitroGLYCERIN, ondansetron (ZOFRAN) IV   Objective: Vitals:   08/04/19 1923 08/05/19 0759  BP: (!) 141/77 (!) 136/71  Pulse: 83 61  Resp: 16   Temp: 98.7 F (37.1 C) 98.2 F (36.8 C)  SpO2: 94% 95%    Intake/Output Summary (Last 24 hours) at 08/05/2019 1107 Last data filed at 08/05/2019 1023 Gross per 24 hour  Intake 240 ml  Output 200 ml  Net 40 ml   Filed Weights   07/26/19 1112 07/31/19 1429 08/03/19 2141  Weight: 59 kg 58.6 kg 58.1 kg   Weight change:  Body mass index is 20.67 kg/m.   Physical Exam: General exam: Appears calm and  comfortable.  Not in physical distress Family preferred not to be examined in detail.  Data Review: I have personally reviewed the laboratory data and studies available.  Recent Labs  Lab 07/30/19 2135 08/01/19 0907 08/02/19 0544  WBC 8.5 8.8 10.7*  NEUTROABS 6.4   --   --   HGB 17.9* 16.5 16.9  HCT 51.3 46.3 47.8  MCV 86.9 85.9 87.4  PLT 152 150 156   Recent Labs  Lab 07/30/19 2135 08/01/19 0907 08/02/19 0544  NA 135 136 138  K 4.5 4.2 4.2  CL 101 104 103  CO2 23 23 25   GLUCOSE 143* 149* 149*  BUN 25* 27* 33*  CREATININE 1.30* 1.09 1.41*  CALCIUM 9.6 9.3 9.5    Signed, , MD [facility] Pager: [phone] (Secure Chat preferred). 08/05/2019

## 2019-08-06 NOTE — Progress Notes (Signed)
Report called to hospice house.

## 2019-08-06 NOTE — TOC Transition Note (Signed)
Transition of Care [facility]) - CM/SW Discharge Note   Patient Details  Name: Bruce [name] MRN: [id number] Date of Birth: 10-01-1932  Transition of Care [facility] CM/SW Contact:  Eilleen Kempf, LCSW Phone Number: 08/06/2019, 3:17 PM   Clinical Narrative:    Eunice Blase with hospice home called and they have a bed available. Patient will be transported via EMS to hospice home room # 3   Final next level of care: Skilled Nursing Facility Barriers to Discharge: Continued Medical Work up, SNF Pending bed offer   Patient Goals and CMS Choice Patient states their goals for this hospitalization and ongoing recovery are:: Pt's wife is requesting skills nursing facility for this patient. CMS Medicare.gov Compare Post Acute Care list provided to:: Patient Represenative (must comment) (Pt's wife) Choice offered to / list presented to : Spouse  Discharge Placement                       Discharge Plan and Services In-house Referral: Clinical Social Work, Orthoptist, Hospice / Palliative Care                                   Social Determinants of Health (SDOH) Interventions     Readmission Risk Interventions No flowsheet data found.

## 2019-08-06 NOTE — Discharge Summary (Signed)
Physician Discharge Summary  Bruce [name] UXL:[id number] DOB: 1932/06/26 DOA: 07/26/2019  PCP: [name], MD  Admit date: 07/26/2019 Discharge date: 08/06/2019  Admitted From: home Discharge disposition: Residential hospice   Code Status: DNR  Diet Recommendation: As tolerated   Recommendations for Outpatient Follow-Up:   1. Per hospice policy  Discharge Diagnosis:   Principal Problem:   Encounter for end of life care Active Problems:   Hyperlipidemia   Type II diabetes mellitus with renal manifestations (HCC)   Dementia without behavioral disturbance (HCC)   Fall   Chest pain   HTN (hypertension)   Rash   CKD (chronic kidney disease), stage IIIa   Elevated troponin   GERD (gastroesophageal reflux disease)   Depression   Pneumonia   Encounter for hospice care discussion   Protein-calorie malnutrition, severe    History of Present Illness / Brief narrative:  Patient is an 86 year old gentleman with history of dementia, diabetes, hypertension hyperlipidemia, peripheral vascular disease. He was brought to the emergency room on 7/14 with fall, chest pain and rash on the back. Unfortunately, patient stayed in the ER for 5 days waiting for placement. Patient's wife was also looking forward to place him at assisted living facility and all work-up was underway. Underwent extensive investigations including brain MRI which was normal. Mildly elevated troponins with no evidence of active ischemia.  Admitted to the hospital with multiple complaints.  On 7/21, patient was noted to be lethargic, had failure to thrive with severe protein calorie malnutrition, advanced dementia.  Patient and family opted for comfort care and hospice.  Hospital Course:  Encounter for end-of-life care Severe protein calorie malnutrition Advanced dementia and severe debility Stage III chronic kidney disease Hypertension Hyperlipidemia GERD  Plan:  Patient is progressively  worsening, currently encephalopathic and without any meaningful chances of recovery. Family desired comfort measures which have been initiated IV Ativan PRN available for episodes of agitation morphine sublingual and morphine IV Benzodiazepine Bowel regimen Okay to discharge to residential hospice today.   Wound care:    Subjective:  Patient was seen and examined this morning.   Sleeping, comfortable.  Per family, he had episodes of agitation last night and this morning.  Per RN, she has been using IV Ativan as needed. Pending bed availability at residential hospice.Marland Kitchen   Discharge Exam:   Vitals:   08/03/19 2141 08/04/19 0720 08/04/19 1923 08/05/19 0759  BP: (!) 121/56 108/69 (!) 141/77 (!) 136/71  Pulse: 60 59 83 61  Resp: 21 17 16    Temp: 98.4 F (36.9 C) 98.4 F (36.9 C) 98.7 F (37.1 C) 98.2 F (36.8 C)  TempSrc: Axillary Axillary Oral Oral  SpO2: 95% 93% 94% 95%  Weight: 58.1 kg     Height:        Body mass index is 20.67 kg/m.  General exam: Appears calm and comfortable at the time of my evaluation.  Not in physical distress Family preferred not to be examined in detail.  Discharge Instructions:   Discharge Instructions    Diet general   Complete by: As directed    Comfort food   For home use only DME Hospital bed   Complete by: As directed    Length of Need: 6 Months   The above medical condition requires: Patient requires the ability to reposition frequently   Head must be elevated greater than: 30 degrees   Bed type: Semi-electric   For home use only DME standard manual wheelchair with seat cushion  Complete by: As directed    Patient suffers from dementia, elderly which impairs their ability to perform daily activities like bathing, dressing, feeding, grooming and toileting in the home.  A walker will not resolve issue with performing activities of daily living. A wheelchair will allow patient to safely perform daily activities. Patient can safely  propel the wheelchair in the home or has a caregiver who can provide assistance. Length of need Lifetime. Accessories: elevating leg rests (ELRs), wheel locks, extensions and anti-tippers.   Increase activity slowly   Complete by: As directed       Follow-up Information    Marguarite ArbourSparks, Jeffrey D, MD.   Specialty: Internal Medicine Contact information: [address] [facility] [place] [address] [phone]              Allergies as of 08/06/2019      Reactions   Beef-derived Products Rash      Medication List    STOP taking these medications   amitriptyline 25 MG tablet Commonly known as: ELAVIL   aspirin EC 81 MG tablet   diphenhydrAMINE 25 mg capsule Commonly known as: BENADRYL   docusate sodium 100 MG capsule Commonly known as: COLACE   doxazosin 4 MG tablet Commonly known as: CARDURA   glimepiride 2 MG tablet Commonly known as: AMARYL   levocetirizine 5 MG tablet Commonly known as: XYZAL   lisinopril 20 MG tablet Commonly known as: ZESTRIL   melatonin 5 MG Tabs   memantine 10 MG tablet Commonly known as: NAMENDA   memantine 5 MG tablet Commonly known as: NAMENDA   metFORMIN 500 MG tablet Commonly known as: GLUCOPHAGE   Multi-Vitamins Tabs   omeprazole 20 MG capsule Commonly known as: PRILOSEC   simvastatin 20 MG tablet Commonly known as: ZOCOR   tamsulosin 0.4 MG Caps capsule Commonly known as: FLOMAX   traZODone 100 MG tablet Commonly known as: Veterinary surgeonDESYREL            Durable Medical Equipment  (From admission, onward)         Start     Ordered   07/28/19 0000  For home use only DME standard manual wheelchair with seat cushion       Comments: Patient suffers from dementia, elderly which impairs their ability to perform daily activities like bathing, dressing, feeding, grooming and toileting in the home.  A walker will not resolve issue with performing activities of daily living. A wheelchair will allow patient to  safely perform daily activities. Patient can safely propel the wheelchair in the home or has a caregiver who can provide assistance. Length of need Lifetime. Accessories: elevating leg rests (ELRs), wheel locks, extensions and anti-tippers.   07/28/19 1359   07/28/19 0000  For home use only DME Hospital bed       Question Answer Comment  Length of Need 6 Months   The above medical condition requires: Patient requires the ability to reposition frequently   Head must be elevated greater than: 30 degrees   Bed type Semi-electric      07/28/19 1359          Time coordinating discharge: 20 minutes  The results of significant diagnostics from this hospitalization (including imaging, microbiology, ancillary and laboratory) are listed below for reference.    Procedures and Diagnostic Studies:   ECHOCARDIOGRAM COMPLETE  Result Date: 08/01/2019    ECHOCARDIOGRAM REPORT   Patient Name:   Bruce [name] Date of Exam: 08/01/2019 Medical Rec #:  [id number]       Height:       66.0 in Accession #:    [id number]      Weight:       129.1 lb Date of Birth:  [birth year]      BSA:          1.660 m Patient Age:    86 years        BP:           107/68 mmHg Patient Gender: M               HR:           54 bpm. Exam Location:  [facility] Procedure: 2D Echo, Color Doppler and Cardiac Doppler Indications:     I21.4 NSTEMI  History:         Patient has no prior history of Echocardiogram examinations.                  Risk Factors:Hypertension and Dyslipidemia. Alzheimer's                  disease.  Sonographer:     Humphrey Rolls RDCS (AE) Referring Phys:  [id number] Lamar Blinks Diagnosing Phys: Arnoldo Hooker MD  Sonographer Comments: Technically challenging study due to limited acoustic windows. Image acquisition challenging due to uncooperative patient. IMPRESSIONS  1. Left ventricular ejection fraction, by estimation, is 45 to 50%. The left ventricle has mildly decreased function. The left ventricle has no regional wall  motion abnormalities. Left ventricular diastolic parameters were normal.  2. Right ventricular systolic function is normal. The right ventricular size is normal.  3. The mitral valve is grossly normal. Trivial mitral valve regurgitation.  4. The aortic valve is grossly normal. Aortic valve regurgitation is not visualized. FINDINGS  Left Ventricle: Left ventricular ejection fraction, by estimation, is 45 to 50%. The left ventricle has mildly decreased function. The left ventricle has no regional wall motion abnormalities. The left ventricular internal cavity size was small. There is no left ventricular hypertrophy. Left ventricular diastolic parameters were normal. Right Ventricle: The right ventricular size is normal. No increase in right ventricular wall thickness. Right ventricular systolic function is normal. Left Atrium: Left atrial size was normal in size. Right Atrium: Right atrial size was normal in size. Pericardium: There is no evidence of pericardial effusion. Mitral Valve: The mitral valve is grossly normal. Trivial mitral valve regurgitation. MV peak gradient, 3.2 mmHg. The mean mitral valve gradient is 1.0 mmHg. Tricuspid Valve: The tricuspid valve is grossly normal. Tricuspid valve regurgitation is trivial. Aortic Valve: The aortic valve is grossly normal. Aortic valve regurgitation is not visualized. Aortic valve mean gradient measures 5.0 mmHg. Aortic valve peak gradient measures 8.4 mmHg. Aortic valve area, by VTI measures 1.99 cm. Pulmonic Valve: The pulmonic valve was normal in structure. Pulmonic valve regurgitation is not visualized. Aorta: The aortic root and ascending aorta are structurally normal, with no evidence of dilitation. IAS/Shunts: No atrial level shunt detected by color flow Doppler.  LEFT VENTRICLE PLAX 2D LVOT diam:     1.70 cm     Diastology LV SV:         59          LV e' lateral:   7.83 cm/s LV SV Index:   35          LV E/e' lateral: 7.6 LVOT Area:     2.27 cm    LV e'  medial:  5.55 cm/s                            LV E/e' medial:  10.7  LV Volumes (MOD) LV vol d, MOD A4C: 42.8 ml LV vol s, MOD A4C: 16.8 ml LV SV MOD A4C:     42.8 ml LEFT ATRIUM           Index LA Vol (A4C): 21.7 ml 13.07 ml/m  AORTIC VALVE AV Area (Vmax):    1.86 cm AV Area (Vmean):   1.70 cm AV Area (VTI):     1.99 cm AV Vmax:           145.00 cm/s AV Vmean:          106.000 cm/s AV VTI:            0.296 m AV Peak Grad:      8.4 mmHg AV Mean Grad:      5.0 mmHg LVOT Vmax:         119.00 cm/s LVOT Vmean:        79.400 cm/s LVOT VTI:          0.259 m LVOT/AV VTI ratio: 0.88 MITRAL VALVE MV Area (PHT): 3.21 cm    SHUNTS MV Peak grad:  3.2 mmHg    Systemic VTI:  0.26 m MV Mean grad:  1.0 mmHg    Systemic Diam: 1.70 cm MV Vmax:       0.90 m/s MV Vmean:      34.2 cm/s MV Decel Time: 236 msec MV E velocity: 59.20 cm/s MV A velocity: 64.50 cm/s MV E/A ratio:  0.92 Arnoldo Hooker MD Electronically signed by Arnoldo Hooker MD Signature Date/Time: 08/01/2019/12:50:28 PM    Final      Labs:   Basic Metabolic Panel: Recent Labs  Lab 07/30/19 2135 07/30/19 2135 08/01/19 0907 08/02/19 0544  NA 135  --  136 138  K 4.5   < > 4.2 4.2  CL 101  --  104 103  CO2 23  --  23 25  GLUCOSE 143*  --  149* 149*  BUN 25*  --  27* 33*  CREATININE 1.30*  --  1.09 1.41*  CALCIUM 9.6  --  9.3 9.5   < > = values in this interval not displayed.   GFR Estimated Creatinine Clearance: 32 mL/min (A) (by C-G formula based on SCr of 1.41 mg/dL (H)). Liver Function Tests: No results for input(s): AST, ALT, ALKPHOS, BILITOT, PROT, ALBUMIN in the last 168 hours. No results for input(s): LIPASE, AMYLASE in the last 168 hours. No results for input(s): AMMONIA in the last 168 hours. Coagulation profile No results for input(s): INR, PROTIME in the last 168 hours.  CBC: Recent Labs  Lab 07/30/19 2135 08/01/19 0907 08/02/19 0544  WBC 8.5 8.8 10.7*  NEUTROABS 6.4  --   --   HGB 17.9* 16.5 16.9  HCT 51.3 46.3 47.8    MCV 86.9 85.9 87.4  PLT 152 150 156   Cardiac Enzymes: No results for input(s): CKTOTAL, CKMB, CKMBINDEX, TROPONINI in the last 168 hours. BNP: Invalid input(s): POCBNP CBG: Recent Labs  Lab 08/01/19 0725 08/01/19 1125 08/01/19 1648 08/01/19 2104 08/02/19 0754  GLUCAP 146* 192* 239* 165* 161*   D-Dimer No results for input(s): DDIMER in the last 72 hours. Hgb A1c No results for input(s): HGBA1C in the last 72 hours. Lipid Profile No results for input(s): CHOL, HDL, LDLCALC,  TRIG, CHOLHDL, LDLDIRECT in the last 72 hours. Thyroid function studies No results for input(s): TSH, T4TOTAL, T3FREE, THYROIDAB in the last 72 hours.  Invalid input(s): FREET3 Anemia work up No results for input(s): VITAMINB12, FOLATE, FERRITIN, TIBC, IRON, RETICCTPCT in the last 72 hours. Microbiology Recent Results (from the past 240 hour(s))  SARS Coronavirus 2 by RT PCR (hospital order, performed in [facility] hospital lab) Nasopharyngeal Nasopharyngeal Swab     Status: None   Collection Time: 07/29/19 12:11 PM   Specimen: Nasopharyngeal Swab  Result Value Ref Range Status   SARS Coronavirus 2 NEGATIVE NEGATIVE Final    Comment: (NOTE) SARS-CoV-2 target nucleic acids are NOT DETECTED.  The SARS-CoV-2 RNA is generally detectable in upper and lower respiratory specimens during the acute phase of infection. The lowest concentration of SARS-CoV-2 viral copies this assay can detect is 250 copies / mL. A negative result does not preclude SARS-CoV-2 infection and should not be used as the sole basis for treatment or other patient management decisions.  A negative result may occur with improper specimen collection / handling, submission of specimen other than nasopharyngeal swab, presence of viral mutation(s) within the areas targeted by this assay, and inadequate number of viral copies (<250 copies / mL). A negative result must be combined with clinical observations, patient history, and  epidemiological information.  Fact Sheet for Patients:   BoilerBrush.com.cy  Fact Sheet for Healthcare Providers: https://pope.com/  This test is not yet approved or  cleared by the [place] FDA and has been authorized for detection and/or diagnosis of SARS-CoV-2 by FDA under an Emergency Use Authorization (EUA).  This EUA will remain in effect (meaning this test can be used) for the duration of the COVID-19 declaration under Section 564(b)(1) of the Act, 21 U.S.C. section 360bbb-3(b)(1), unless the authorization is terminated or revoked sooner.  Performed at [facility], [address], [place], [place] [address]     Please note: You were cared for by a hospitalist during your hospital stay. Once you are discharged, your primary care physician will handle any further medical issues. Please note that NO REFILLS for any discharge medications will be authorized once you are discharged, as it is imperative that you return to your primary care physician (or establish a relationship with a primary care physician if you do not have one) for your post hospital discharge needs so that they can reassess your need for medications and monitor your lab values.  Signed: Melina Schools Elleen Coulibaly  [facility] 08/06/2019, 1:35 PM

## 2019-08-06 NOTE — Progress Notes (Signed)
PROGRESS NOTE  Bruce [name]  DOB: [birth year]  PCP: [name], MD F[facility]:[id number]  DOA: 07/26/2019  LOS: 5 days   Chief Complaint  Patient presents with  . Hypotension   Brief narrative: Patient is an 86 year old gentleman with history of dementia, diabetes, hypertension hyperlipidemia, peripheral vascular disease. He was brought to the emergency room on 7/14 with fall, chest pain and rash on the back. Unfortunately, patient  stayed in the ER for 5 days waiting for placement. Patient's wife was also looking forward to place him at assisted living facility and all work-up was underway. Underwent extensive investigations including brain MRI which was normal. Mildly elevated troponins with no evidence of active ischemia.  Admitted to the hospital with multiple complaints.  On 7/21, patient was noted to be lethargic, had failure to thrive with severe protein calorie malnutrition, advanced dementia.  Patient and family opted for comfort care and hospice. Pending bed availability at residential hospice.  Subjective: Patient was seen and examined this morning.   Sleeping, comfortable.  Per family, he had episodes of agitation last night and this morning.  Per RN, she has been using IV Ativan as needed. Pending bed availability at residential hospice..  Assessment: Encounter for end-of-life care Severe protein calorie malnutrition Advanced dementia and severe debility Stage III chronic kidney disease Hypertension Hyperlipidemia GERD  Plan:  Patient is progressively worsening, currently encephalopathic and without any meaningful chances of recovery. Family desired comfort measures which have been initiated No labs. No telemetry. IV Ativan PRN available for episodes of agitation RN to pronounce death. Wrote for morphine sublingual and morphine IV Benzodiazepine Bowel regimen Transfer to inpatient hospice when bed is available.  He may pass away in the hospital waiting for  bed.  Code Status:   Code Status: DNR  Nutritional status: Body mass index is 20.67 kg/m. Nutrition Problem: Severe Malnutrition Etiology: social / environmental circumstances (dementia, advanced age) Signs/Symptoms: moderate fat depletion, severe fat depletion, severe muscle depletion Diet Order            Diet general           DIET - DYS 1 Room service appropriate? No; Fluid consistency: Nectar Thick  Diet effective now                 Family Communication:  Family at bedside.  Status is: Inpatient  Remains inpatient appropriate because pending availability at residential hospice.  Dispo:  Patient From: Home  Planned Disposition: Residential Hospice  Expected discharge date: Whenever bed is available.  Infusions:    Scheduled Meds: . hydrocortisone   Topical BID    Antimicrobials: Anti-infectives (From admission, onward)   None      PRN meds: acetaminophen **OR** acetaminophen, acetaminophen, diphenhydrAMINE, glycopyrrolate **OR** glycopyrrolate **OR** glycopyrrolate, LORazepam **OR** LORazepam **OR** LORazepam, morphine injection, morphine CONCENTRATE **OR** morphine CONCENTRATE, nitroGLYCERIN, ondansetron (ZOFRAN) IV   Objective: Vitals:   08/04/19 1923 08/05/19 0759  BP: (!) 141/77 (!) 136/71  Pulse: 83 61  Resp: 16   Temp: 98.7 F (37.1 C) 98.2 F (36.8 C)  SpO2: 94% 95%    Intake/Output Summary (Last 24 hours) at 08/06/2019 1122 Last data filed at 08/06/2019 0521 Gross per 24 hour  Intake --  Output 0 ml  Net 0 ml   Filed Weights   07/26/19 1112 07/31/19 1429 08/03/19 2141  Weight: 59 kg 58.6 kg 58.1 kg   Weight change:  Body mass index is 20.67 kg/m.   Physical Exam: General exam: Appears  calm and comfortable at the time of my evaluation.  Not in physical distress Family preferred not to be examined in detail.  Data Review: I have personally reviewed the laboratory data and studies available.  Recent Labs  Lab 07/30/19 2135  08/01/19 0907 08/02/19 0544  WBC 8.5 8.8 10.7*  NEUTROABS 6.4  --   --   HGB 17.9* 16.5 16.9  HCT 51.3 46.3 47.8  MCV 86.9 85.9 87.4  PLT 152 150 156   Recent Labs  Lab 07/30/19 2135 08/01/19 0907 08/02/19 0544  NA 135 136 138  K 4.5 4.2 4.2  CL 101 104 103  CO2 23 23 25   GLUCOSE 143* 149* 149*  BUN 25* 27* 33*  CREATININE 1.30* 1.09 1.41*  CALCIUM 9.6 9.3 9.5    Signed, , MD [facility] Pager: [phone] (Secure Chat preferred). 08/06/2019

## 2019-08-06 NOTE — Progress Notes (Signed)
Patient restless at times, Given PRN Ativan , Family in room , patient repositioned to right side for pressure relief and comfort. HOB at 45. Patient resting at this time with eyes closed. Call bell in reach , and family educated to call for personal needs

## 2019-08-13 DEATH — deceased

## 2019-09-07 IMAGING — CT CT HEAD W/O CM
3 of 6 series · 15 of 47 positions shown, 18 images · non-contrast
Comparison: Brain MRI dated 07/03/2013

CLINICAL DATA: 84-year-old male with fall and facial trauma

EXAM:
CT HEAD WITHOUT CONTRAST
CT MAXILLOFACIAL WITHOUT CONTRAST
TECHNIQUE: Multidetector CT imaging of the head and maxillofacial structures
were performed using the standard protocol without intravenous
contrast. Multiplanar CT image reconstructions of the maxillofacial
structures were also generated.

[Series 5: max soft (person_name) · axial · 0.34mm/px · z∈[-176,-20]mm · 10 of 96 slices shown, 13 images]
[im 9/96  brain]
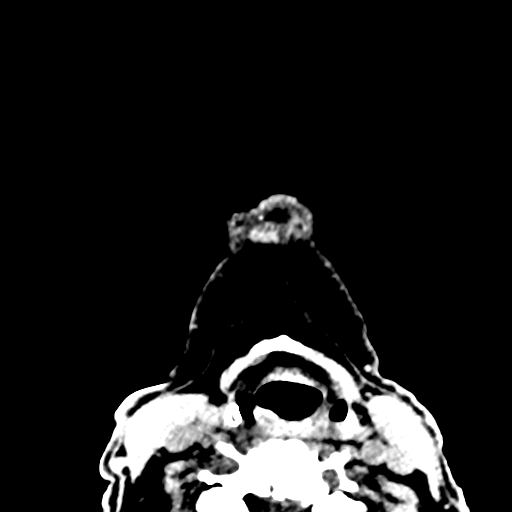
[im 9/96  bone]
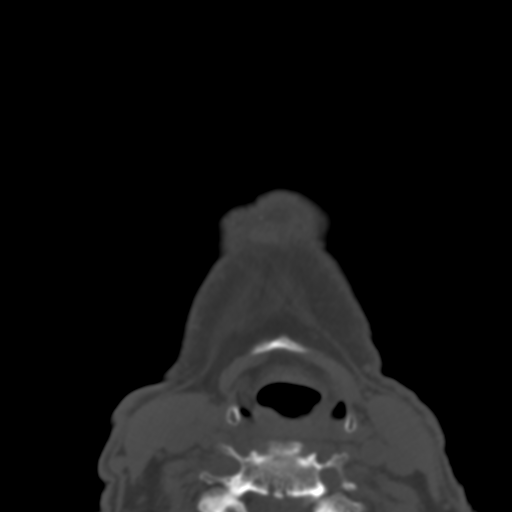
[im 18/96  brain]
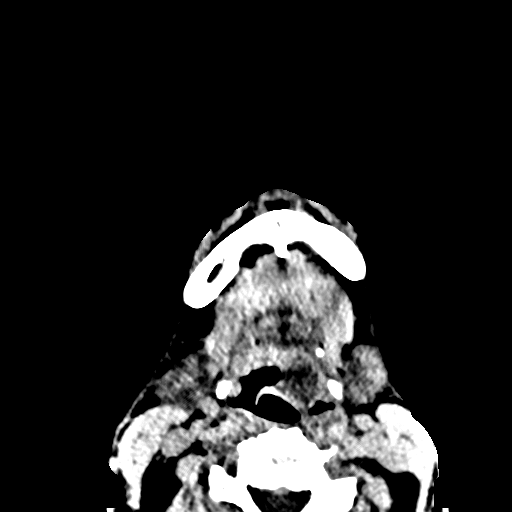
[im 26/96  brain]
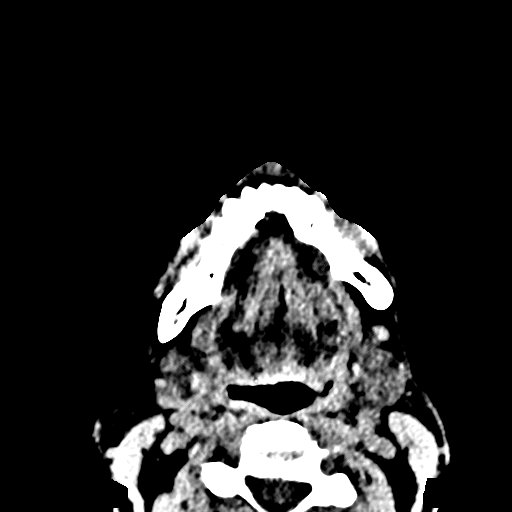
[im 35/96  brain]
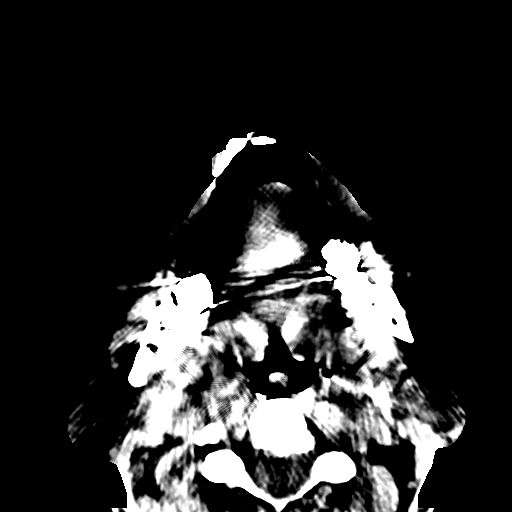
[im 44/96  brain]
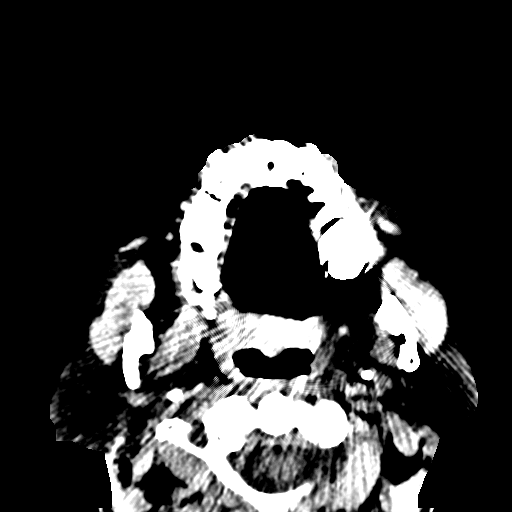
[im 44/96  bone]
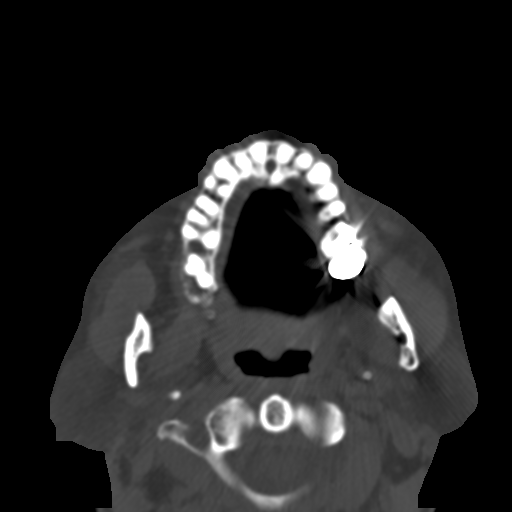
[im 52/96  brain]
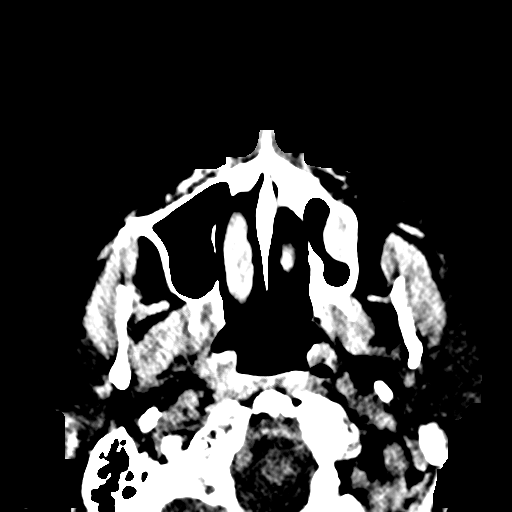
[im 61/96  brain]
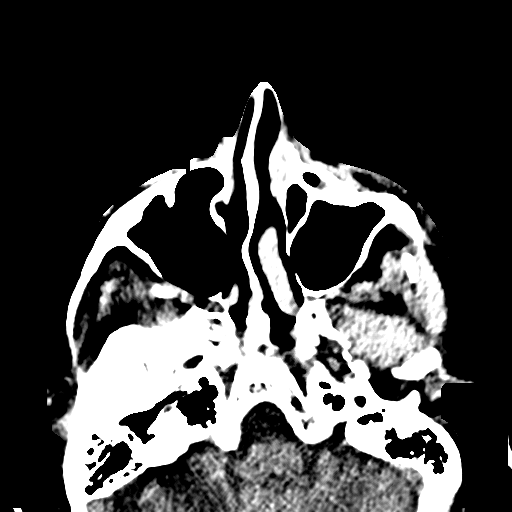
[im 70/96  brain]
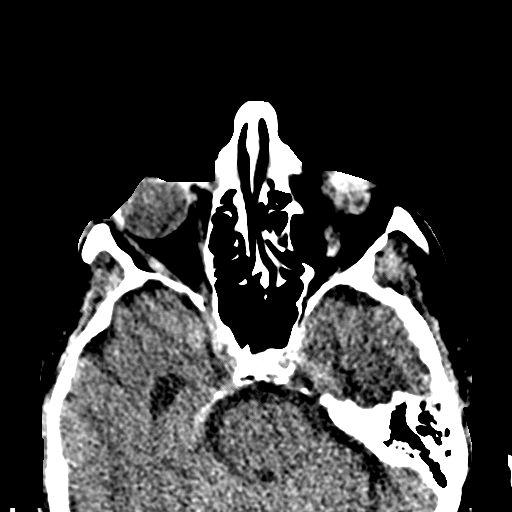
[im 78/96  brain]
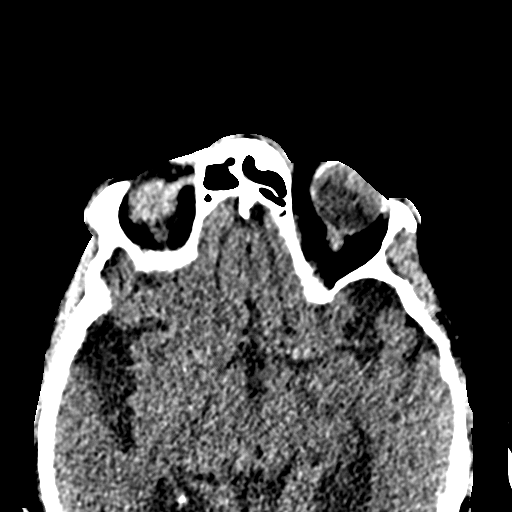
[im 78/96  bone]
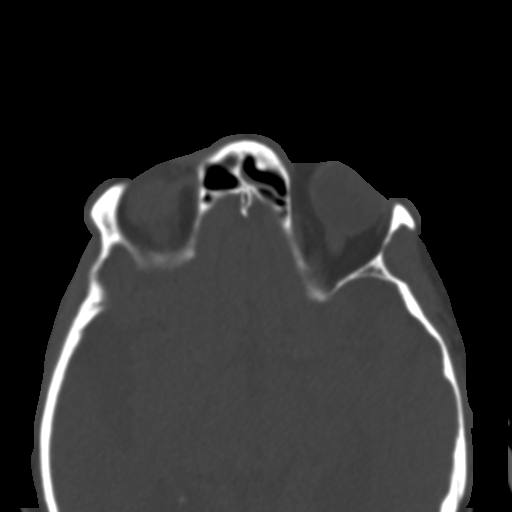
[im 87/96  brain]
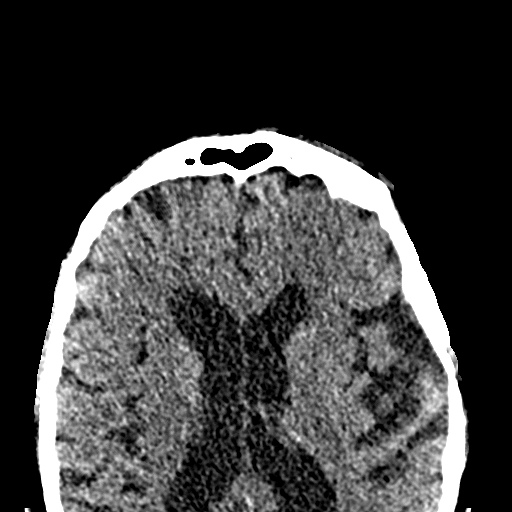

[Series 8: coronal soft tissue · coronal · 0.30mm/px · 3 of 66 slices shown]
[im 17/66  brain]
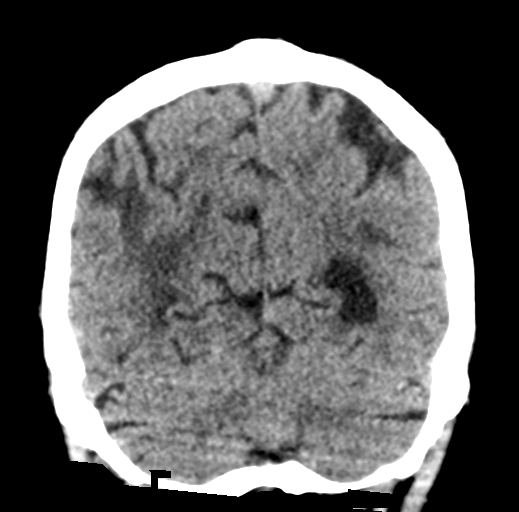
[im 33/66  brain]
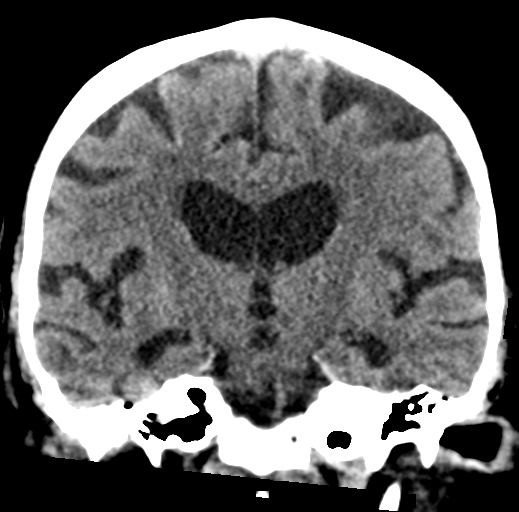
[im 49/66  brain]
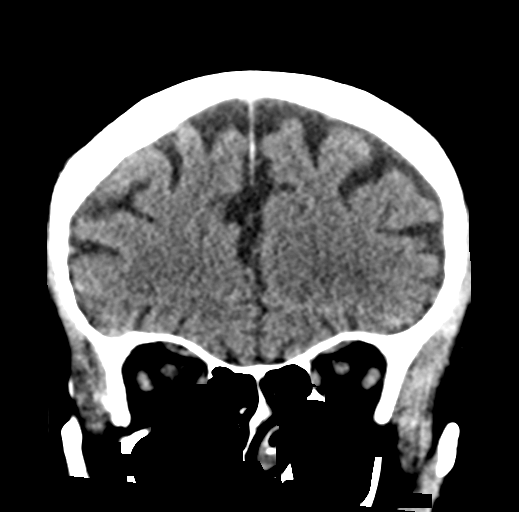

[Series 11: sagittal soft · sagittal · 0.28mm/px · 2 of 86 slices shown]
[im 29/86  brain]
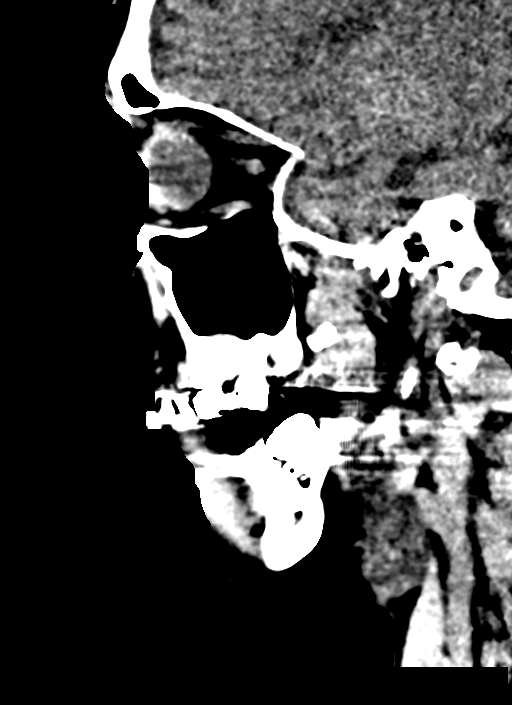
[im 57/86  brain]
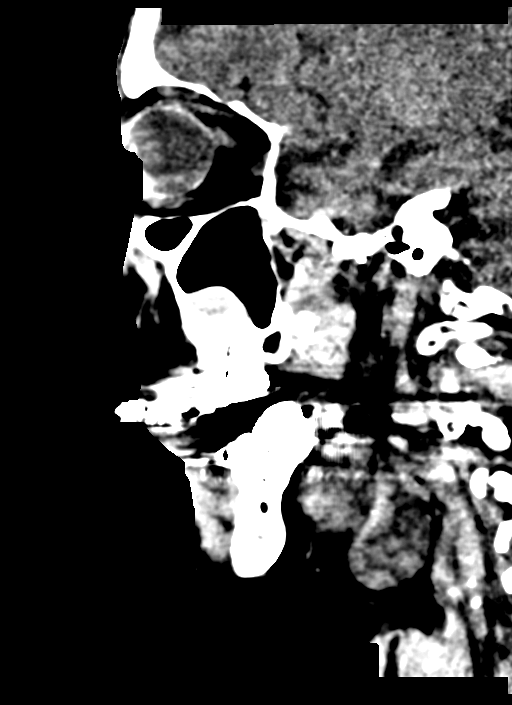

[15 of 47 positions shown; findings below may reference images not displayed]

FINDINGS: CT HEAD FINDINGS

Brain: There is moderate to advanced age-related atrophy and chronic
microvascular ischemic changes an area low-attenuation in the right
posterior parietal lobe most consistent with chronic changes and old
infarct. There is a small amount of extra-axial hemorrhage along the
right posterior parietal lobe adjacent to the midline, likely
combination of small subdural and subarachnoid bleed and measures up
to 8 mm (sagittal series 9, image 26). Small amount of subarachnoid
hemorrhage is also noted in the right occipital lobe. There is no
mass effect or midline shift.

Vascular: No hyperdense vessel or unexpected calcification.

Skull: Normal. Negative for fracture or focal lesion.

Other: None

CT MAXILLOFACIAL FINDINGS

Osseous: There is no acute fracture or dislocation. Arthritic
changes of the TMJ bilaterally.

Orbits: The globes and retro-orbital fat are preserved

Sinuses: Mild diffuse mucoperiosteal thickening of paranasal
sinuses. Small bilateral maxillary sinus retention cysts or polyps.
No air-fluid levels.

Soft tissues: Left facial soft tissue contusion.  No large hematoma.
IMPRESSION: 1. Right parietal and occipital extra-axial hemorrhage, likely
combination of subdural and subarachnoid hemorrhage. No mass effect
or midline shift.
2. No acute/traumatic facial bone fractures.

These results were called by telephone at the time of interpretation
on 03/05/2017 at [DATE] to Dr. DHOPEST GAMETI , who verbally
acknowledged these results.
